# Patient Record
Sex: Female | Born: 1945 | Race: White | Hispanic: No | Marital: Married | Smoking: Former smoker
Health system: Southern US, Community
[De-identification: ages and names within clinical notes are randomized; demographics above are authoritative.]

## PROBLEM LIST (undated history)

## (undated) DIAGNOSIS — E785 Hyperlipidemia, unspecified: Secondary | ICD-10-CM

## (undated) DIAGNOSIS — M8580 Other specified disorders of bone density and structure, unspecified site: Secondary | ICD-10-CM

## (undated) DIAGNOSIS — D649 Anemia, unspecified: Secondary | ICD-10-CM

## (undated) DIAGNOSIS — H269 Unspecified cataract: Secondary | ICD-10-CM

## (undated) DIAGNOSIS — M810 Age-related osteoporosis without current pathological fracture: Secondary | ICD-10-CM

## (undated) DIAGNOSIS — S8290XA Unspecified fracture of unspecified lower leg, initial encounter for closed fracture: Secondary | ICD-10-CM

## (undated) DIAGNOSIS — Z8719 Personal history of other diseases of the digestive system: Secondary | ICD-10-CM

## (undated) DIAGNOSIS — K219 Gastro-esophageal reflux disease without esophagitis: Secondary | ICD-10-CM

## (undated) DIAGNOSIS — J302 Other seasonal allergic rhinitis: Secondary | ICD-10-CM

## (undated) DIAGNOSIS — T7840XA Allergy, unspecified, initial encounter: Secondary | ICD-10-CM

## (undated) DIAGNOSIS — D689 Coagulation defect, unspecified: Secondary | ICD-10-CM

## (undated) DIAGNOSIS — I959 Hypotension, unspecified: Secondary | ICD-10-CM

## (undated) HISTORY — DX: Other seasonal allergic rhinitis: J30.2

## (undated) HISTORY — PX: UPPER GASTROINTESTINAL ENDOSCOPY: SHX188

## (undated) HISTORY — PX: TOE SURGERY: SHX1073

## (undated) HISTORY — DX: Other specified disorders of bone density and structure, unspecified site: M85.80

## (undated) HISTORY — DX: Gastro-esophageal reflux disease without esophagitis: K21.9

## (undated) HISTORY — PX: OTHER SURGICAL HISTORY: SHX169

## (undated) HISTORY — PX: COLONOSCOPY: SHX174

## (undated) HISTORY — DX: Unspecified cataract: H26.9

## (undated) HISTORY — DX: Anemia, unspecified: D64.9

## (undated) HISTORY — DX: Unspecified fracture of unspecified lower leg, initial encounter for closed fracture: S82.90XA

## (undated) HISTORY — DX: Allergy, unspecified, initial encounter: T78.40XA

## (undated) HISTORY — DX: Hyperlipidemia, unspecified: E78.5

## (undated) HISTORY — DX: Hypotension, unspecified: I95.9

## (undated) HISTORY — DX: Coagulation defect, unspecified: D68.9

## (undated) HISTORY — DX: Personal history of other diseases of the digestive system: Z87.19

## (undated) HISTORY — DX: Age-related osteoporosis without current pathological fracture: M81.0

---

## 1998-10-11 ENCOUNTER — Other Ambulatory Visit: Admission: RE | Admit: 1998-10-11 | Discharge: 1998-10-11 | Payer: Self-pay | Admitting: Gynecology

## 1999-07-06 ENCOUNTER — Encounter: Admission: RE | Admit: 1999-07-06 | Discharge: 1999-07-06 | Payer: Self-pay | Admitting: Emergency Medicine

## 1999-07-06 ENCOUNTER — Encounter: Payer: Self-pay | Admitting: Emergency Medicine

## 1999-11-16 ENCOUNTER — Other Ambulatory Visit: Admission: RE | Admit: 1999-11-16 | Discharge: 1999-11-16 | Payer: Self-pay | Admitting: Gynecology

## 2000-07-18 ENCOUNTER — Encounter: Admission: RE | Admit: 2000-07-18 | Discharge: 2000-07-18 | Payer: Self-pay | Admitting: Emergency Medicine

## 2000-07-18 ENCOUNTER — Encounter: Payer: Self-pay | Admitting: Emergency Medicine

## 2001-01-28 ENCOUNTER — Other Ambulatory Visit: Admission: RE | Admit: 2001-01-28 | Discharge: 2001-01-28 | Payer: Self-pay | Admitting: Gynecology

## 2001-02-08 ENCOUNTER — Encounter (INDEPENDENT_AMBULATORY_CARE_PROVIDER_SITE_OTHER): Payer: Self-pay

## 2001-02-08 ENCOUNTER — Other Ambulatory Visit: Admission: RE | Admit: 2001-02-08 | Discharge: 2001-02-08 | Payer: Self-pay | Admitting: Gynecology

## 2001-12-25 ENCOUNTER — Encounter: Admission: RE | Admit: 2001-12-25 | Discharge: 2001-12-25 | Payer: Self-pay | Admitting: Emergency Medicine

## 2001-12-25 ENCOUNTER — Encounter: Payer: Self-pay | Admitting: Emergency Medicine

## 2002-01-15 ENCOUNTER — Encounter: Payer: Self-pay | Admitting: Emergency Medicine

## 2002-01-15 ENCOUNTER — Encounter: Admission: RE | Admit: 2002-01-15 | Discharge: 2002-01-15 | Payer: Self-pay | Admitting: Emergency Medicine

## 2004-02-10 ENCOUNTER — Other Ambulatory Visit: Admission: RE | Admit: 2004-02-10 | Discharge: 2004-02-10 | Payer: Self-pay | Admitting: Gynecology

## 2004-02-15 ENCOUNTER — Ambulatory Visit (HOSPITAL_COMMUNITY): Admission: RE | Admit: 2004-02-15 | Discharge: 2004-02-15 | Payer: Self-pay | Admitting: Emergency Medicine

## 2005-02-08 ENCOUNTER — Ambulatory Visit: Payer: Self-pay | Admitting: Gastroenterology

## 2005-02-21 ENCOUNTER — Ambulatory Visit: Payer: Self-pay | Admitting: Gastroenterology

## 2005-02-23 ENCOUNTER — Ambulatory Visit (HOSPITAL_COMMUNITY): Admission: RE | Admit: 2005-02-23 | Discharge: 2005-02-23 | Payer: Self-pay | Admitting: Emergency Medicine

## 2005-02-27 ENCOUNTER — Other Ambulatory Visit: Admission: RE | Admit: 2005-02-27 | Discharge: 2005-02-27 | Payer: Self-pay | Admitting: Gynecology

## 2006-04-16 ENCOUNTER — Ambulatory Visit (HOSPITAL_COMMUNITY): Admission: RE | Admit: 2006-04-16 | Discharge: 2006-04-16 | Payer: Self-pay | Admitting: Emergency Medicine

## 2006-04-17 ENCOUNTER — Other Ambulatory Visit: Admission: RE | Admit: 2006-04-17 | Discharge: 2006-04-17 | Payer: Self-pay | Admitting: Gynecology

## 2007-04-25 ENCOUNTER — Ambulatory Visit (HOSPITAL_COMMUNITY): Admission: RE | Admit: 2007-04-25 | Discharge: 2007-04-25 | Payer: Self-pay | Admitting: Emergency Medicine

## 2007-05-20 ENCOUNTER — Other Ambulatory Visit: Admission: RE | Admit: 2007-05-20 | Discharge: 2007-05-20 | Payer: Self-pay | Admitting: Gynecology

## 2008-05-11 ENCOUNTER — Ambulatory Visit (HOSPITAL_COMMUNITY): Admission: RE | Admit: 2008-05-11 | Discharge: 2008-05-11 | Payer: Self-pay | Admitting: Emergency Medicine

## 2008-08-04 ENCOUNTER — Other Ambulatory Visit: Admission: RE | Admit: 2008-08-04 | Discharge: 2008-08-04 | Payer: Self-pay | Admitting: Gynecology

## 2009-03-24 ENCOUNTER — Encounter: Payer: Self-pay | Admitting: Gastroenterology

## 2009-06-02 ENCOUNTER — Ambulatory Visit (HOSPITAL_COMMUNITY): Admission: RE | Admit: 2009-06-02 | Discharge: 2009-06-02 | Payer: Self-pay | Admitting: Emergency Medicine

## 2009-09-17 ENCOUNTER — Encounter (INDEPENDENT_AMBULATORY_CARE_PROVIDER_SITE_OTHER): Payer: Self-pay | Admitting: *Deleted

## 2009-09-27 ENCOUNTER — Encounter (INDEPENDENT_AMBULATORY_CARE_PROVIDER_SITE_OTHER): Payer: Self-pay | Admitting: *Deleted

## 2009-09-27 ENCOUNTER — Ambulatory Visit (HOSPITAL_COMMUNITY): Admission: RE | Admit: 2009-09-27 | Discharge: 2009-09-27 | Payer: Self-pay | Admitting: Obstetrics and Gynecology

## 2009-11-08 ENCOUNTER — Ambulatory Visit: Payer: Self-pay | Admitting: Gastroenterology

## 2010-02-22 ENCOUNTER — Encounter (INDEPENDENT_AMBULATORY_CARE_PROVIDER_SITE_OTHER): Payer: Self-pay | Admitting: *Deleted

## 2010-02-28 ENCOUNTER — Encounter (INDEPENDENT_AMBULATORY_CARE_PROVIDER_SITE_OTHER): Payer: Self-pay | Admitting: *Deleted

## 2010-03-01 ENCOUNTER — Ambulatory Visit: Payer: Self-pay | Admitting: Gastroenterology

## 2010-03-17 ENCOUNTER — Ambulatory Visit: Payer: Self-pay | Admitting: Gastroenterology

## 2010-10-13 NOTE — Letter (Signed)
Summary: New Patient letter  [facility]  [address] [place], [place] [address]   Phone: [phone]  Fax: [phone]       09/17/2009 MRN: [id number]  [facility] [address] [place], [place]  [address]  Dear Alexa Morris,  Welcome to the Gastroenterology Division at [facility].    You are scheduled to see Dr.  Arlyce Dice on 10-13-09 at 10AM on the 3rd floor at [facility], [address]  We ask that you try to arrive at our office 15 minutes prior to your appointment time to allow for check-in.  We would like you to complete the enclosed self-administered evaluation form prior to your visit and bring it with you on the day of your appointment.  We will review it with you.  Also, please bring a complete list of all your medications or, if you prefer, bring the medication bottles and we will list them.  Please bring your insurance card so that we may make a copy of it.  If your insurance requires a referral to see a specialist, please bring your referral form from your primary care physician.  Co-payments are due at the time of your visit and may be paid by cash, check or credit card.     Your office visit will consist of a consult with your physician (includes a physical exam), any laboratory testing he/she may order, scheduling of any necessary diagnostic testing (e.g. x-ray, ultrasound, CT-scan), and scheduling of a procedure (e.g. Endoscopy, Colonoscopy) if required.  Please allow enough time on your schedule to allow for any/all of these possibilities.    If you cannot keep your appointment, please call [phone] to cancel or reschedule prior to your appointment date.  This allows [place] the opportunity to schedule an appointment for another patient in need of care.  If you do not cancel or reschedule by 5 p.m. the business day prior to your appointment date, you will be charged a $50.00 late cancellation/no-show fee.    Thank you for choosing  [facility] for your medical needs.  We appreciate the opportunity to care for you.  Please visit [place] at our website  to learn more about our practice.                     Sincerely,                                                             The Gastroenterology Division

## 2010-10-13 NOTE — Op Note (Signed)
Alexa Morris, Alexa Morris             ACCOUNT NO.:  [id number]      MEDICAL RECORD NO.:  [id number]          PATIENT TYPE:  AMB      LOCATION:  SDC                           FACILITY:  [facility]      PHYSICIAN:  Guy Sandifer. Henderson Cloud, M.D. DATE OF BIRTH:  1946-06-21      DATE OF PROCEDURE:  09/27/2009   DATE OF DISCHARGE:  09/27/2009                                  OPERATIVE REPORT      PREOPERATIVE DIAGNOSIS:  Endometrial polyp.      POSTOPERATIVE DIAGNOSIS:  Endometrial polyp.      PROCEDURE:  Hysteroscopy with resectoscope, dilatation and curettage.      SURGEON:  Guy Sandifer. Henderson Cloud, MD      ANESTHESIA:  General with LMA.      SPECIMENS:   1. Endometrial resection.   2. Endometrial curetting, both to Pathology.      ESTIMATED BLOOD LOSS:  Minimal.      I and O's are distending media, 35 mL deficit.      INDICATIONS AND CONSENT:  This patient is a 65 year old married white   female with an endometrial polypoid-type structure noted on ultrasound.   Hysteroscopy, resectoscope, dilatation and curettage has been discussed   preoperatively.  Potential risks and complications have been discussed   preoperatively including, but not limited to infection, organ damage,   uterine perforation, bleeding requiring transfusion of blood products   with HIV and hepatitis acquisition, DVT, PE, pneumonia, laparoscopy,   laparotomy, recurrent polyps, hysterectomy.  All questions have been   answered and consent is signed on the chart.      FINDINGS:  Fallopian tube ostia identified bilaterally.  There is a 10   to 12 mm polypoid-type structure off the upper uterine cavity.  The   endometrium was atrophic.      PROCEDURE IN DETAILS:  The patient was taken to the operating room where   she was identified, placed in dorsosupine position and general   anesthesia was induced via LMA.  She was placed in dorsal lithotomy   position.  Time-out was undertaken.  She was prepped with Betadine.   Bladder  straight catheterized and draped in a sterile fashion.  Bivalve   speculum was placed in the vagina.  Anterior cervical lip was injected   with 1% plain Xylocaine and grasped with a single-tooth tenaculum.   Paracervical block was placed at 2, 4, 5, 7, 8, and 10 o'clock positions   with approximately 20 mL total of the same solution.  Cervix was gently   progressively dilated.  A 2.9 resectoscope was placed in the   endocervical canal and advanced under direct visualization using   distending media.  The above findings were noted.  The polyp was   resected in a simple fashion with a single right-angle wire loop.  This   removed   the entire polyp.  Excellent hemostasis was noted.  Sharp curettage was   carried out for scant amount of tissue.  Good hemostasis was noted.  All   counts were correct.  The  patient was awakened, taken to recovery room   in stable condition.                  Guy Sandifer Henderson Cloud, M.D.   Electronically Signed            JET/MEDQ  D:  09/27/2009  T:  09/28/2009  Job:  [id number]

## 2010-10-13 NOTE — Letter (Signed)
Summary: [facility] Instructions  [facility]  [address] [place], [place] [address]   Phone: [phone]  Fax: [phone]       [facility]    27-Jan-1946    MRN: [id number]        Procedure Day /Date:  03/17/10  Thursday     Arrival Time:  2:00pm         Procedure Time:  3:00pm     Location of Procedure:                    _x _  [facility] (4th Floor)   PREPARATION FOR COLONOSCOPY WITH MOVIPREP   Starting 5 days prior to your procedure _7/2/11 _ do not eat nuts, seeds, popcorn, corn, beans, peas,  salads, or any raw vegetables.  Do not take any fiber supplements (e.g. Metamucil, Citrucel, and Benefiber).  THE DAY BEFORE YOUR PROCEDURE         DATE:  03/16/10  DAY:  Wednesday  1.  Drink clear liquids the entire day-NO SOLID FOOD  2.  Do not drink anything colored red or purple.  Avoid juices with pulp.  No orange juice.  3.  Drink at least 64 oz. (8 glasses) of fluid/clear liquids during the day to prevent dehydration and help the prep work efficiently.  CLEAR LIQUIDS INCLUDE: Water Jello Ice Popsicles Tea (sugar ok, no milk/cream) Powdered fruit flavored drinks Coffee (sugar ok, no milk/cream) Gatorade Juice: apple, white grape, white cranberry  Lemonade Clear bullion, consomm, broth Carbonated beverages (any kind) Strained chicken noodle soup Hard Candy                             4.  In the morning, mix first dose of MoviPrep solution:    Empty 1 Pouch A and 1 Pouch B into the disposable container    Add lukewarm drinking water to the top line of the container. Mix to dissolve    Refrigerate (mixed solution should be used within 24 hrs)  5.  Begin drinking the prep at 5:00 p.m. The MoviPrep container is divided by 4 marks.   Every 15 minutes drink the solution down to the next mark (approximately 8 oz) until the full liter is complete.   6.  Follow completed prep with 16 oz of clear liquid of your choice (Nothing red or  purple).  Continue to drink clear liquids until bedtime.  7.  Before going to bed, mix second dose of MoviPrep solution:    Empty 1 Pouch A and 1 Pouch B into the disposable container    Add lukewarm drinking water to the top line of the container. Mix to dissolve    Refrigerate  THE DAY OF YOUR PROCEDURE      DATE:  03/17/10 DAY:  Thursday  Beginning at  10:00am  (5 hours before procedure):         1. Every 15 minutes, drink the solution down to the next mark (approx 8 oz) until the full liter is complete.  2. Follow completed prep with 16 oz. of clear liquid of your choice.    3. You may drink clear liquids until 1:00pm   (2 HOURS BEFORE PROCEDURE).   MEDICATION INSTRUCTIONS  Unless otherwise instructed, you should take regular prescription medications with a small sip of water   as early as possible the morning of your procedure.   Additional medication instructions:  n/a         OTHER INSTRUCTIONS  You will need a responsible adult at least 63 years of age to accompany you and drive you home.   This person must remain in the waiting room during your procedure.  Wear loose fitting clothing that is easily removed.  Leave jewelry and other valuables at home.  However, you may wish to bring a book to read or  an iPod/MP3 player to listen to music as you wait for your procedure to start.  Remove all body piercing jewelry and leave at home.  Total time from sign-in until discharge is approximately 2-3 hours.  You should go home directly after your procedure and rest.  You can resume normal activities the  day after your procedure.  The day of your procedure you should not:   Drive   Make legal decisions   Operate machinery   Drink alcohol   Return to work  You will receive specific instructions about eating, activities and medications before you leave.    The above instructions have been reviewed and explained to me by  Sherren Kerns RN  March 01, 2010 3:09  PM     I fully understand and can verbalize these instructions _____________________________ Date _________

## 2010-10-13 NOTE — Procedures (Signed)
Summary: Colonoscopy  Patient: Alexa Morris Note: All result statuses are Final unless otherwise noted.  Tests: (1) Colonoscopy (COL)   COL Colonoscopy           DONE     [facility]     [address]     [place], [place]  [address]           COLONOSCOPY PROCEDURE REPORT           PATIENT:  Alexa Morris, Alexa Morris  MR#:  [id number]     BIRTHDATE:  June 19, 1946, 64 yrs. old  GENDER:  female           ENDOSCOPIST:  Barbette Hair. Arlyce Dice, MD     Referred by:           PROCEDURE DATE:  03/17/2010     PROCEDURE:  Diagnostic Colonoscopy     ASA CLASS:  Class I     INDICATIONS:  1) Elevated Risk Screening  2) family history of     colon cancer Father           MEDICATIONS:   Fentanyl 75 mcg IV, Versed 6 mg IV           DESCRIPTION OF PROCEDURE:   After the risks benefits and     alternatives of the procedure were thoroughly explained, informed     consent was obtained.  Digital rectal exam was performed and     revealed no abnormalities.   The LB CF-[id number] endoscope     was introduced through the anus and advanced to the cecum, which     was identified by both the appendix and ileocecal valve, without     limitations.  The quality of the prep was excellent, using     MoviPrep.  The instrument was then slowly withdrawn as the colon     was fully examined.     <<PROCEDUREIMAGES>>           FINDINGS:  A normal appearing cecum, ileocecal valve, and     appendiceal orifice were identified. The ascending, hepatic     flexure, transverse, splenic flexure, descending, sigmoid colon,     and rectum appeared unremarkable (see image1, image2, image3,     image4, image6, image10, image12, and image13).   Retroflexed     views in the rectum revealed no abnormalities.    The time to     cecum =  8.25  minutes. The scope was then withdrawn (time =  6.0     min) from the patient and the procedure completed.           COMPLICATIONS:  A complication of hypoxemia occured on 03/17/2010  at.  Pt developed very transient hypoxemia (O2 sat 78% for less     than 3 seconds)           ENDOSCOPIC IMPRESSION:     1) Normal colon     RECOMMENDATIONS:     1) Given your significant family history of colon cancer, you     should have a repeat colonoscopy in 5 years           Pt will be examined with propofol sedation in the future           REPEAT EXAM:  In 5 year(s) for Colonoscopy.           ______________________________     Barbette Hair. Arlyce Dice, MD           CC:  Leslee Home, MD           n.     Rosalie Doctor:   Barbette Hair. Lucky Alverson at 03/17/2010 03:52 PM           Regino Schultze, [id number]  Note: An exclamation mark (!) indicates a result that was not dispersed into the flowsheet. Document Creation Date: 03/17/2010 3:53 PM _______________________________________________________________________  (1) Order result status: Final Collection or observation date-time: 03/17/2010 15:46 Requested date-time:  Receipt date-time:  Reported date-time:  Referring Physician:   Ordering Physician: Melvia Heaps [phone]) Specimen Source:  Source: Launa Grill Order Number: [id number] Lab site:   Appended Document: Colonoscopy    Clinical Lists Changes  Observations: Added new observation of COLONNXTDUE: 03/2015 (03/17/2010 16:03)

## 2010-10-13 NOTE — Miscellaneous (Signed)
Summary: previsit/rm  Clinical Lists Changes  Medications: Added new medication of MOVIPREP 100 GM  SOLR (PEG-KCL-NACL-NASULF-NA ASC-C) As per prep instructions. - Signed Rx of MOVIPREP 100 GM  SOLR (PEG-KCL-NACL-NASULF-NA ASC-C) As per prep instructions.;  #1 x 0;  Signed;  Entered by: Sherren Kerns RN;  Authorized by: Louis Meckel MD;  Method used: Electronically to [facility]  [address] [phone]*, [address] [place], [place]  [address], Ph: [phone] or [phone], Fax: [phone] Allergies: Added new allergy or adverse reaction of ASPIRIN Observations: Added new observation of ALLERGY REV: Done (03/01/2010 14:24)    Prescriptions: MOVIPREP 100 GM  SOLR (PEG-KCL-NACL-NASULF-NA ASC-C) As per prep instructions.  #1 x 0   Entered by:   Sherren Kerns RN   Authorized by:   Louis Meckel MD   Signed by:   Sherren Kerns RN on 03/01/2010   Method used:   [facility]        [facility]  [address] [phone]* (retail)       [address]       [place], [place]  [address]       Ph: [phone] or [phone]       Fax: [phone]   RxID:   [id number]

## 2010-10-13 NOTE — Assessment & Plan Note (Signed)
Summary: CHANGE IN BM & HEMORROIDS/YF   History of Present Illness Visit Type: Initial Consult Primary GI MD: Melvia Heaps MD [facility] Requesting Provider: Leslee Home, MD Chief Complaint: Change in bowel habits History of Present Illness:   Alexa Morris is a pleasant 65 year old white female referred at the request of Dr. Lorenz Coaster for colonoscopy.  Family history is pertinent for her father who had colon cancer.  Last colonoscopy in 2006 was normal.  She had several days of lower abdominal pain and passage of dark colored stools.  Symptoms subsided following removal of a uterine polyp.  She is now has no GI complaints.   GI Review of Systems    Reports abdominal pain.     Location of  Abdominal pain: upper abdomen.    Denies acid reflux, belching, bloating, chest pain, dysphagia with liquids, dysphagia with solids, heartburn, loss of appetite, nausea, vomiting, vomiting blood, weight loss, and  weight gain.      Reports black tarry stools, change in bowel habits, and  hemorrhoids.     Denies anal fissure, constipation, diarrhea, diverticulosis, fecal incontinence, heme positive stool, irritable bowel syndrome, jaundice, light color stool, liver problems, rectal bleeding, and  rectal pain. Preventive Screening-Counseling & Management  Alcohol-Tobacco     Smoking Status: quit      Drug Use:  no.      Current Medications (verified): 1)  Fexofenadine Hcl 180 Mg Tabs (Fexofenadine Hcl) .... As Needed 2)  Caltrate 600+d 600-400 Mg-Unit Tabs (Calcium Carbonate-Vitamin D) .... Once Daily 3)  Multivitamins  Tabs (Multiple Vitamin) .... Once Daily  Allergies (verified): 1)  ! * Z-Pac 2)  ! * Charcoal Medicine  Past History:  Past Medical History: Reviewed history from 10/12/2009 and no changes required. 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  Alcohol Use - yes Daily Caffeine Use Illicit Drug Use - no Smoking Status:  quit Drug Use:  no  Review of Systems       The patient complains of allergy/sinus and nosebleeds.  The patient denies anemia, anxiety-new, arthritis/joint pain, back pain, blood in urine, breast changes/lumps, change in vision, confusion, cough, coughing up blood, depression-new, fainting, fatigue, fever, headaches-new, hearing problems, heart murmur, heart rhythm changes, itching, menstrual pain, muscle pains/cramps, night sweats, pregnancy symptoms, shortness of breath, skin rash, sleeping problems, sore throat, swelling of feet/legs, swollen lymph glands, thirst - excessive , urination - excessive , urination changes/pain, urine leakage, vision changes, and voice change.         All other systems were reviewed and were negative   Vital Signs:  Patient profile:   65 year old female Height:      63 inches Weight:      117.38 pounds BMI:     20.87 Pulse rate:   64 / minute BP sitting:   98 / 66  (left arm) Cuff size:   regular  Vitals Entered By: June McMurray CMA Duncan Dull) (November 08, 2009 4:07 PM)  Physical Exam  Additional Exam:  She is a healthy-appearing female  skin: anicteric HEENT: normocephalic; PEERLA; no nasal or pharyngeal abnormalities neck: supple nodes: no cervical lymphadenopathy chest: clear to ausculatation and percussion heart: no murmurs, gallops, or rubs abd: soft, nontender; BS normoactive; no abdominal masses, tenderness, organomegaly rectal: deferred ext: no cynanosis, clubbing, edema skeletal: no deformities  neuro: oriented x 3; no focal abnormalities    Impression & Recommendations:  Problem # 1:  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  Patient's questions were answered.  Patient Instructions: 1)  CC Dr. Leslee Home 2)  You will need to call back to schedule your colonoscopy at your convenience 3)  You will be scheduled for a previsit with a nurse at the time they schedule you for your screening colonoscopy 4)  The medication list was reviewed and reconciled.  All changed / newly prescribed medications were explained.  A complete medication list was provided to the patient / caregiver.

## 2010-10-13 NOTE — Procedures (Signed)
Summary: Colonoscopy   Colonoscopy  Procedure date:  02/21/2005  Findings:      Results: Normal. Location:  [facility].    Comments:      Repeat colonoscopy in 5 years.  Patient Name: Alexa, Morris MRN:  Procedure Procedures: Colorectal cancer screening, high risk CPT: G0105.  Personnel: Endoscopist: Barbette Hair. Arlyce Dice, MD.  Patient Consent: Procedure, Alternatives, Risks and Benefits discussed, consent obtained, from patient.  Indications  Increased Risk Screening: For family history of colorectal neoplasia, in  parent grandparent  History  Current Medications: Patient is not currently taking Coumadin.  Pre-Exam Physical: Performed Feb 21, 2005. Cardio-pulmonary exam, HEENT exam , Abdominal exam WNL.  Exam Exam: Extent of exam reached: Cecum, extent intended: Cecum.  The cecum was identified by IC valve. Colon retroflexion performed. ASA Classification: I. Tolerance: good.  Monitoring: Pulse and BP monitoring, Oximetry used. Supplemental O2 given. at 2 Liters.  Colon Prep Used Miralax for colon prep. Prep results: good.  Sedation Meds: Patient assessed and found to be appropriate for moderate (conscious) sedation. Sedation was managed by the Endoscopist. Fentanyl 100 mcg. given IV. Versed 10 mg. given IV.  Findings - NORMAL EXAM: Cecum to Rectum.  NORMAL EXAM: Cecum.  NORMAL EXAM: Rectum.   Assessment Normal examination.  Events  Unplanned Interventions: No intervention was required.  Unplanned Events: There were no complications. Plans  Post Exam Instructions: Post sedation instructions given.  Patient Education: Patient given standard instructions for: a normal exam.  Scheduling/Referral: Colonoscopy, to Molly Maduro D. Arlyce Dice, MD, around Feb 21, 2010.    This report was created from the original endoscopy report, which was reviewed and signed by the above listed endoscopist.

## 2010-10-13 NOTE — Letter (Signed)
Summary: Colonoscopy Letter  [facility]  [address] [place], [place] [address]   Phone: [phone]  Fax: [phone]      February 22, 2010 MRN: [id number]   [facility] [address] [place], [place]  [address]   Dear Ms. Massmann,   According to your medical record, it is time for you to schedule a Colonoscopy. The American Cancer Society recommends this procedure as a method to detect early colon cancer. Patients with a family history of colon cancer, or a personal history of colon polyps or inflammatory bowel disease are at increased risk.  This letter has been generated based on the recommendations made at the time of your procedure. If you feel that in your particular situation this may no longer apply, please contact our office.  Please call our office at [phone] to schedule this appointment or to update your records at your earliest convenience.  Thank you for cooperating with [place] to provide you with the very best care possible.   Sincerely,  Barbette Hair. Arlyce Dice, M.d.  [facility] [phone]

## 2010-10-31 ENCOUNTER — Encounter (HOSPITAL_COMMUNITY): Payer: Self-pay

## 2010-10-31 ENCOUNTER — Other Ambulatory Visit (HOSPITAL_COMMUNITY): Payer: Self-pay | Admitting: Emergency Medicine

## 2010-10-31 ENCOUNTER — Ambulatory Visit (HOSPITAL_COMMUNITY)
Admission: RE | Admit: 2010-10-31 | Discharge: 2010-10-31 | Disposition: A | Discharge status: Home / Self Care | Payer: BC Managed Care – PPO | Source: Ambulatory Visit | Attending: Emergency Medicine | Admitting: Emergency Medicine

## 2010-10-31 DIAGNOSIS — Z1231 Encounter for screening mammogram for malignant neoplasm of breast: Secondary | ICD-10-CM

## 2010-11-27 LAB — COMPREHENSIVE METABOLIC PANEL
ALT: 16 U/L (ref 0–35)
Albumin: 4.1 g/dL (ref 3.5–5.2)
BUN: 22 mg/dL (ref 6–23)
CO2: 27 mEq/L (ref 19–32)
Chloride: 103 mEq/L (ref 96–112)
Creatinine, Ser: 0.69 mg/dL (ref 0.4–1.2)
GFR calc Af Amer: >60 mL/min (ref >60)
Glucose, Bld: 87 mg/dL (ref 70–99)
Potassium: 3.9 mEq/L (ref 3.5–5.1)
Sodium: 138 mEq/L (ref 135–145)
Total Protein: 7.2 g/dL (ref 6.0–8.3)

## 2010-11-27 LAB — CBC
HCT: 40.1 % (ref 36.0–46.0)
Hemoglobin: 13.6 g/dL (ref 12.0–15.0)
Platelets: 260 10*3/uL (ref 150–400)
RBC: 4.28 MIL/uL (ref 3.87–5.11)

## 2011-03-13 ENCOUNTER — Other Ambulatory Visit (HOSPITAL_COMMUNITY): Payer: Self-pay | Admitting: Emergency Medicine

## 2011-03-13 DIAGNOSIS — M8580 Other specified disorders of bone density and structure, unspecified site: Secondary | ICD-10-CM

## 2011-03-16 ENCOUNTER — Ambulatory Visit (HOSPITAL_COMMUNITY)
Admission: RE | Admit: 2011-03-16 | Discharge: 2011-03-16 | Disposition: A | Discharge status: Home / Self Care | Payer: BC Managed Care – PPO | Source: Ambulatory Visit | Attending: Emergency Medicine | Admitting: Emergency Medicine

## 2011-03-16 DIAGNOSIS — Z13820 Encounter for screening for osteoporosis: Secondary | ICD-10-CM | POA: Insufficient documentation

## 2011-03-16 DIAGNOSIS — M899 Disorder of bone, unspecified: Secondary | ICD-10-CM | POA: Insufficient documentation

## 2011-03-16 DIAGNOSIS — M8580 Other specified disorders of bone density and structure, unspecified site: Secondary | ICD-10-CM

## 2012-03-25 ENCOUNTER — Other Ambulatory Visit: Payer: Self-pay | Admitting: Surgery

## 2012-04-24 ENCOUNTER — Other Ambulatory Visit (HOSPITAL_COMMUNITY): Payer: Self-pay | Admitting: Internal Medicine

## 2012-04-24 DIAGNOSIS — Z1231 Encounter for screening mammogram for malignant neoplasm of breast: Secondary | ICD-10-CM

## 2012-05-16 ENCOUNTER — Ambulatory Visit (HOSPITAL_COMMUNITY): Payer: BC Managed Care – PPO

## 2012-05-22 ENCOUNTER — Ambulatory Visit (HOSPITAL_COMMUNITY)
Admission: RE | Admit: 2012-05-22 | Discharge: 2012-05-22 | Disposition: A | Discharge status: Home / Self Care | Payer: BC Managed Care – PPO | Source: Ambulatory Visit | Attending: Internal Medicine | Admitting: Internal Medicine

## 2012-05-22 DIAGNOSIS — Z1231 Encounter for screening mammogram for malignant neoplasm of breast: Secondary | ICD-10-CM

## 2012-07-10 ENCOUNTER — Ambulatory Visit (INDEPENDENT_AMBULATORY_CARE_PROVIDER_SITE_OTHER): Payer: BC Managed Care – PPO | Admitting: Family Medicine

## 2012-07-10 VITALS — BP 126/78 | HR 81 | Temp 98.8°F | Resp 17 | Ht 62.5 in | Wt 127.0 lb

## 2012-07-10 DIAGNOSIS — J029 Acute pharyngitis, unspecified: Secondary | ICD-10-CM

## 2012-07-10 MED ORDER — AMOXICILLIN 875 MG PO TABS: 875.0000 mg | ORAL_TABLET | Freq: Two times a day (BID) | ORAL | Status: DC

## 2012-07-10 NOTE — Progress Notes (Signed)
@UMFCLOGO @   Patient ID: Alexa Morris MRN: [id number], DOB: Feb 19, 1946, 66 y.o. Date of Encounter: 07/10/2012, 6:47 PM  Primary Physician: No primary provider on file.  Chief Complaint:  Chief Complaint  Patient presents with  . Sore Throat    1 week     HPI: 66 y.o. year old female presents with 7 day history of sore throat. Subjective fever and chills. No cough, congestion, rhinorrhea, sinus pressure, otalgia, or headache. Normal hearing. No GI complaints. Able to swallow saliva, but hurts to do so. Decreased appetite secondary to sore throat.   History reviewed. No pertinent past medical history.   Home Meds: Prior to Admission medications   Medication Sig Start Date End Date Taking? Authorizing Provider  atorvastatin (LIPITOR) 20 MG tablet Take 20 mg by mouth daily.   Yes Historical Provider, MD  Calcium Carbonate-Vitamin D (CALTRATE 600+D) 600-400 MG-UNIT per tablet Take 1 tablet by mouth daily.     Yes Historical Provider, MD  amoxicillin (AMOXIL) 875 MG tablet Take 1 tablet (875 mg total) by mouth 2 (two) times daily. 07/10/12   Elvina Sidle, MD  fexofenadine (ALLEGRA) 180 MG tablet Take 180 mg by mouth as needed.      Historical Provider, MD  Multiple Vitamin (MULTIVITAMIN) tablet Take 1 tablet by mouth daily.      Historical Provider, MD    Allergies:  Allergies  Allergen Reactions  . Aspirin     REACTION: does not take,clotting slow    History   Social History  . Marital Status: Married    Spouse Name: N/A    Number of Children: N/A  . Years of Education: N/A   Occupational History  . Not on file.   Social History Main Topics  . Smoking status: Never Smoker   . Smokeless tobacco: Not on file  . Alcohol Use: 0.6 oz/week    1 Glasses of wine per week  . Drug Use: Not on file  . Sexually Active: No   Other Topics Concern  . Not on file   Social History Narrative  . No narrative on file     Review of Systems: Constitutional: negative for  chills, fever, night sweats or weight changes HEENT: see above Cardiovascular: negative for chest pain or palpitations Respiratory: negative for hemoptysis, wheezing, or shortness of breath Abdominal: negative for abdominal pain, nausea, vomiting or diarrhea Dermatological: negative for rash Neurologic: negative for headache   Physical Exam: Blood pressure 126/78, pulse 81, temperature 98.8 F (37.1 C), temperature source Oral, resp. rate 17, height 5' 2.5" (1.588 m), weight 127 lb (57.607 kg), SpO2 99.00%., Body mass index is 22.86 kg/(m^2). General: Well developed, well nourished, in no acute distress. Head: Normocephalic, atraumatic, eyes without discharge, sclera non-icteric, nares are patent. Bilateral auditory canals clear, TM's are without perforation, pearly grey with reflective cone of light bilaterally. No sinus TTP. Oral cavity moist, dentition normal. Posterior pharynx with post nasal drip and mild erythema. No peritonsillar abscess or tonsillar exudate. Neck: Supple. No thyromegaly. Full ROM. No lymphadenopathy. Lungs: Clear bilaterally to auscultation without wheezes, rales, or rhonchi. Breathing is unlabored. Heart: RRR with S1 S2. No murmurs, rubs, or gallops appreciated. Abdomen: Soft, non-tender, non-distended with normoactive bowel sounds. No hepatomegaly. No rebound/guarding. No obvious abdominal masses. Msk:  Strength and tone normal for age. Extremities: No clubbing or cyanosis. No edema. Neuro: Alert and oriented X 3. Moves all extremities spontaneously. CNII-XII grossly in tact. Psych:  Responds to questions appropriately with a normal  affect.      ASSESSMENT AND PLAN:  66 y.o. year old female with pharyngitis 1. Pharyngitis  amoxicillin (AMOXIL) 875 MG tablet, Culture, Group A Strep    - -Tylenol/Motrin prn -Rest/fluids -RTC precautions -RTC 3-5 days if no improvement  Signed, Elvina Sidle, MD 07/10/2012 6:47 PM

## 2012-07-10 NOTE — Patient Instructions (Signed)
Faringitis (viral y bacteriana) (Pharyngitis, Viral and Bacterial) Es una inflamacin (irritacin) o infeccin de la faringe. Tambin se denomina dolor de garganta.  CAUSAS La mayor parte de las anginas son causadas por virus y son parte de un resfro. Sin embargo, algunas anginas son ocasionadas por la bacteria estreptococo (germen) o por otras bacterias. La causa de la angina tambin puede ser el goteo nasal proveniente de los senos paranasales, alergias y, en algunos casos, se produce incluso por dormir con la boca abierta. Las anginas infecciosas pueden contagiarse de una persona a otra por la tos, el estornudo y por compartir tasas o cubiertos. TRATAMIENTO Las anginas de causa viral generalmente duran entre 3 y 4 das. Estas infecciones virales generalmente mejoran sin antibiticos (medicamentos que destruyen grmenes). Las anginas por estreptococo y otras bacterias (grmenes) generalmente mejoran despus de 24 a 48 horas de comenzar el tratamiento con antibiticos. INSTRUCCIONES PARA EL CUIDADO DOMICILIARIO  Si en profesional considera que se trata de una infeccin bacteriana o si hay una prueba positiva para el estreptococo, le prescribir un antibitico. Debe tomar todos los antibiticos hasta completar el tratamiento. Si no completa el tratamiento con antibiticos, usted o su hijo podrn enfermar nuevamente. Si usted o su hijo presentan un estreptococo en la garganta y no completan el tratamiento, podran sufrir un trastorno grave en el corazn o en los riones.   Beba gran cantidad de lquidos. Alrededor de 8-10 vasos grandes de agua por da. (Puede ser agua, jugos, licuados de frutas, Kool-aid, Gatorade, gaseosas, etc.).   Utilice los medicamentos de venta libre o de prescripcin para el dolor, el malestar o la fiebre, segn se lo indique el profesional que lo asiste.   Descanse lo suficiente.   Hgase grgaras con agua salada (1/2 cucharadita de sal en un vaso de agua) cada 1-2 horas si  lo necesita para aliviar las molestias.   Si el paciente es mayor de siete aos, ofrzcale caramelos duros o aerosoles o pastillas para la tos.  SOLICITE ATENCIN MDICA SI:  Si le aparecen bultos grandes y dolorosos en el cuello.   Presenta una erupcin.   Elimina un esputo verde, marrn-amarillento o sanguinolento.   El beb tiene ms de 3 meses y su temperatura rectal es de 100.5 F (38.1 C) o ms durante ms de 1 da.  SOLICITE ATENCIN MDICA DE INMEDIATO SI:  Siente rigidez en el cuello.   Usted o su hijo babean o no pueden tragar lquidos.   Usted o su hijo vomitan, no pueden retener los medicamentos o los lquidos.   Usted o su hijo presentan dolor intenso, que no se alivia con los medicamentos que le han recetado.   Usted o su hijo presentan dificultad para respirar (y no se debe a la nariz congestionada).   Usted o su hijo no pueden abrir la boca completamente.   Usted o su nio presentan aumento del dolor, hinchazn, enrojecimiento en el cuello.   Tiene fiebre.   Su beb tiene ms de 3 meses y su temperatura rectal es de 102 F (38.9 C) o ms.   Su beb tiene 3 meses o menos y su temperatura rectal es de 100.4 F (38 C) o ms.  EST SEGURO QUE:   Comprende las instrucciones para el alta mdica.   Controlar su enfermedad.   Solicitar atencin mdica de inmediato segn las indicaciones.  Document Released: 06/07/2005 Document Revised: 08/17/2011 [employer] Patient Information 2012 [employer]. 

## 2012-07-13 LAB — CULTURE, GROUP A STREP: Organism ID, Bacteria: NORMAL

## 2013-06-30 ENCOUNTER — Other Ambulatory Visit (HOSPITAL_COMMUNITY): Payer: Self-pay | Admitting: Internal Medicine

## 2013-06-30 DIAGNOSIS — Z1231 Encounter for screening mammogram for malignant neoplasm of breast: Secondary | ICD-10-CM

## 2013-07-10 ENCOUNTER — Ambulatory Visit (HOSPITAL_COMMUNITY)
Admission: RE | Admit: 2013-07-10 | Discharge: 2013-07-10 | Disposition: A | Discharge status: Home / Self Care | Payer: BC Managed Care – PPO | Source: Ambulatory Visit | Attending: Internal Medicine | Admitting: Internal Medicine

## 2013-07-10 DIAGNOSIS — Z1231 Encounter for screening mammogram for malignant neoplasm of breast: Secondary | ICD-10-CM | POA: Insufficient documentation

## 2013-10-07 ENCOUNTER — Ambulatory Visit (INDEPENDENT_AMBULATORY_CARE_PROVIDER_SITE_OTHER): Payer: BC Managed Care – PPO | Admitting: Emergency Medicine

## 2013-10-07 VITALS — BP 114/62 | HR 94 | Temp 100.5°F | Resp 18 | Ht 62.0 in | Wt 129.2 lb

## 2013-10-07 DIAGNOSIS — R509 Fever, unspecified: Secondary | ICD-10-CM

## 2013-10-07 DIAGNOSIS — IMO0001 Reserved for inherently not codable concepts without codable children: Secondary | ICD-10-CM

## 2013-10-07 DIAGNOSIS — M791 Myalgia, unspecified site: Secondary | ICD-10-CM

## 2013-10-07 LAB — POCT CBC
GRANULOCYTE PERCENT: 74.7 % (ref 37–80)
HEMATOCRIT: 42 % (ref 37.7–47.9)
HEMOGLOBIN: 13.1 g/dL (ref 12.2–16.2)
Lymph, poc: 2.9 (ref 0.6–3.4)
MCH: 30 pg (ref 27–31.2)
MCHC: 31.2 g/dL — AB (ref 31.8–35.4)
MCV: 96.3 fL (ref 80–97)
MID (CBC): 0.6 (ref 0–0.9)
MPV: 8.2 fL (ref 0–99.8)
PLATELET COUNT, POC: 341 10*3/uL (ref 142–424)
POC GRANULOCYTE: 10.3 — AB (ref 2–6.9)
POC LYMPH PERCENT: 20.7 %L (ref 10–50)
POC MID %: 4.6 % (ref 0–12)
RBC: 4.36 M/uL (ref 4.04–5.48)
RDW, POC: 13.2 %
WBC: 13.8 10*3/uL — AB (ref 4.6–10.2)

## 2013-10-07 NOTE — Patient Instructions (Addendum)

## 2013-10-07 NOTE — Progress Notes (Signed)
[facility] [address] [place] [address] [phone]  Date:  10/07/2013   Name:  Alexa Morris   DOB:  1945-12-09   MRN:  [id number]  PCP:  Jerlyn Ly, MD    Chief Complaint: Back Pain, Hip Pain, Shoulder Pain and Neck Pain   History of Present Illness:  Alexa Morris is a 68 y.o. very pleasant female patient who presents with the following:  Ill since Friday with myalgias and arthralgias and fever to 100.5. Fever most prominent today.  No rash, insect bites, no joint swelling.  No nausea or vomiting, no cough, wheezing or shortness of breath. No sore throat.  No stool change.  Is an Statistician.  No improvement with over the counter medications or other home remedies. Denies other complaint or health concern today.   There are no active problems to display for this patient.   History reviewed. No pertinent past medical history.  Past Surgical History  Procedure Laterality Date  . Laparascopy    . Toe surgery      History  Substance Use Topics  . Smoking status: Never Smoker   . Smokeless tobacco: Not on file  . Alcohol Use: 0.6 oz/week    1 Glasses of wine per week    Family History  Problem Relation Age of Onset  . Kidney disease Father   . Diabetes Son     Allergies  Allergen Reactions  . Aspirin     REACTION: does not take,clotting slow    Medication list has been reviewed and updated.  Current Outpatient Prescriptions on File Prior to Visit  Medication Sig Dispense Refill  . Calcium Carbonate-Vitamin D (CALTRATE 600+D) 600-400 MG-UNIT per tablet Take 1 tablet by mouth daily.        . fexofenadine (ALLEGRA) 180 MG tablet Take 180 mg by mouth as needed.        . Multiple Vitamin (MULTIVITAMIN) tablet Take 1 tablet by mouth daily.        Marland Kitchen atorvastatin (LIPITOR) 20 MG tablet Take 20 mg by mouth daily.       No current facility-administered medications on file prior to visit.    Review of Systems:  Viral  infection Treat symptomatically   Physical Examination: Filed Vitals:   10/07/13 1935  BP: 114/62  Pulse: 94  Temp: 100.5 F (38.1 C)  Resp: 18   Filed Vitals:   10/07/13 1935  Height: 5\' 2"  (1.575 m)  Weight: 129 lb 3.2 oz (58.605 kg)   Body mass index is 23.63 kg/(m^2). Ideal Body Weight: Weight in (lb) to have BMI = 25: 136.4  GEN: WDWN, NAD, Non-toxic, A & O x 3 HEENT: Atraumatic, Normocephalic. Neck supple. No masses, No LAD. Ears and Nose: No external deformity. CV: RRR, No M/G/R. No JVD. No thrill. No extra heart sounds. PULM: CTA B, no wheezes, crackles, rhonchi. No retractions. No resp. distress. No accessory muscle use. ABD: S, NT, ND, +BS. No rebound. No HSM. EXTR: No c/c/e NEURO Normal gait.  PSYCH: Normally interactive. Conversant. Not depressed or anxious appearing.  Calm demeanor.    Assessment and Plan: Viral febrile disorder Fluids NSAID  Signed,  Ellison Carwin, MD   Results for orders placed in visit on 10/07/13  POCT CBC      Result Value Range   WBC 13.8 (*) 4.6 - 10.2 K/uL   Lymph, poc 2.9  0.6 - 3.4   POC LYMPH PERCENT 20.7  10 -  50 %L   MID (cbc) 0.6  0 - 0.9   POC MID % 4.6  0 - 12 %M   POC Granulocyte 10.3 (*) 2 - 6.9   Granulocyte percent 74.7  37 - 80 %G   RBC 4.36  4.04 - 5.48 M/uL   Hemoglobin 13.1  12.2 - 16.2 g/dL   HCT, POC 42.0  37.7 - 47.9 %   MCV 96.3  80 - 97 fL   MCH, POC 30.0  27 - 31.2 pg   MCHC 31.2 (*) 31.8 - 35.4 g/dL   RDW, POC 13.2     Platelet Count, POC 341  142 - 424 K/uL   MPV 8.2  0 - 99.8 fL

## 2014-06-12 ENCOUNTER — Emergency Department (HOSPITAL_COMMUNITY)
Admission: EM | Admit: 2014-06-12 | Discharge: 2014-06-12 | Disposition: A | Discharge status: Home / Self Care | Payer: BC Managed Care – PPO | Source: Home / Self Care | Attending: Family Medicine | Admitting: Family Medicine

## 2014-06-12 ENCOUNTER — Encounter (HOSPITAL_COMMUNITY): Payer: Self-pay | Admitting: Emergency Medicine

## 2014-06-12 DIAGNOSIS — J040 Acute laryngitis: Secondary | ICD-10-CM

## 2014-06-12 LAB — POCT RAPID STREP A: Streptococcus, Group A Screen (Direct): NEGATIVE

## 2014-06-12 MED ORDER — IPRATROPIUM BROMIDE 0.06 % NA SOLN: 2.0000 | Freq: Four times a day (QID) | NASAL | Status: DC

## 2014-06-12 MED ORDER — AZITHROMYCIN 250 MG PO TABS: 250.0000 mg | ORAL_TABLET | Freq: Every day | ORAL | Status: DC

## 2014-06-12 MED ORDER — PREDNISONE 10 MG PO TABS: 30.0000 mg | ORAL_TABLET | Freq: Every day | ORAL | Status: DC

## 2014-06-12 NOTE — ED Notes (Signed)
C/o sore throat onset x1 week Sx also include hoarseness Denies f/v/n/d Alert, no signs of acute distress.

## 2014-06-12 NOTE — ED Provider Notes (Signed)
Alexa Morris is a 68 y.o. female who [facility] today for sore throat. Patient is a one week history of sore throat voice. Associated with a mild nonproductive cough. No fevers or chills nausea vomiting or diarrhea or shortness of breath. She's tried gargling warm liquids and Nexium which have not helped. 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 No chest pain palpitations or shortness of breath.   History reviewed. No pertinent past medical history. History  Substance Use Topics  . Smoking status: Never Smoker   . Smokeless tobacco: Not on file  . Alcohol Use: 0.6 oz/week    1 Glasses of wine per week   ROS as above Medications: No current facility-administered medications for this encounter.   Current Outpatient Prescriptions  Medication Sig Dispense Refill  . atorvastatin (LIPITOR) 20 MG tablet Take 20 mg by mouth daily.      Marland Kitchen azithromycin (ZITHROMAX) 250 MG tablet Take 1 tablet (250 mg total) by mouth daily. Take first 2 tablets together, then 1 every day until finished.  6 tablet  0  . Calcium Carbonate-Vitamin D (CALTRATE 600+D) 600-400 MG-UNIT per tablet Take 1 tablet by mouth daily.        . fexofenadine (ALLEGRA) 180 MG tablet Take 180 mg by mouth as needed.        Marland Kitchen ipratropium (ATROVENT) 0.06 % nasal spray Place 2 sprays into both nostrils 4 (four) times daily.  15 mL  1  . Multiple Vitamin (MULTIVITAMIN) tablet Take 1 tablet by mouth daily.        . predniSONE (DELTASONE) 10 MG tablet Take 3 tablets (30 mg total) by mouth daily.  15 tablet  0    Exam:  BP 146/90  Pulse 70  Temp(Src) 97.6 F (36.4 C) (Oral)  Resp 20  SpO2 95% Gen: Well NAD HEENT: EOMI,  MMM posterior pharynx with cobblestoning. Normal tympanic membranes bilaterally. Lungs: Normal work of breathing. CTABL Heart: RRR no MRG Abd: NABS, Soft. Nondistended, Nontender Exts: Brisk capillary refill, warm and well perfused.   Results for orders placed during  the hospital encounter of 06/12/14 (from the past 24 hour(s))  POCT RAPID STREP A ([place])     Status: None   Collection Time    06/12/14  7:25 PM      Result Value Ref Range   Streptococcus, Group A Screen (Direct) NEGATIVE  NEGATIVE   No results found.  Assessment and Plan: 68 y.o. female with laryngitis/pharyngitis.  Plan to treat with prednisone and Atrovent spray. Use Azithromycin if not better.  Call or go to the emergency room if you get worse, have trouble breathing, have chest pains, or palpitations.    Discussed warning signs or symptoms. Please see discharge instructions. Patient expresses understanding.     Gregor Hams, MD 06/12/14 [phone]

## 2014-06-12 NOTE — Discharge Instructions (Signed)
Thank you for coming in today. Please take prednisone for 5 days and use Atrovent nasal spray. Take the azithromycin antibiotics if not better. Call or go to the emergency room if you get worse, have trouble breathing, have chest pains, or palpitations.   Laryngitis At the top of your windpipe is your voice box. It is the source of your voice. Inside your voice box are 2 bands of muscles called vocal cords. When you breathe, your vocal cords are relaxed and open so that air can get into the lungs. When you decide to say something, these cords come together and vibrate. The sound from these vibrations goes into your throat and comes out through your mouth as sound. Laryngitis is an inflammation of the vocal cords that causes hoarseness, cough, loss of voice, sore throat, and dry throat. Laryngitis can be temporary (acute) or long-term (chronic). Most cases of acute laryngitis improve with time.Chronic laryngitis lasts for more than 3 weeks. CAUSES Laryngitis can often be related to excessive smoking, talking, or yelling, as well as inhalation of toxic fumes and allergies. Acute laryngitis is usually caused by a viral infection, vocal strain, measles or mumps, or bacterial infections. Chronic laryngitis is usually caused by vocal cord strain, vocal cord injury, postnasal drip, growths on the vocal cords, or acid reflux. SYMPTOMS   Cough.  Sore throat.  Dry throat. RISK FACTORS  Respiratory infections.  Exposure to irritating substances, such as cigarette smoke, excessive amounts of alcohol, stomach acids, and workplace chemicals.  Voice trauma, such as vocal cord injury from shouting or speaking too loud. DIAGNOSIS  Your cargiver will perform a physical exam. During the physical exam, your caregiver will examine your throat. The most common sign of laryngitis is hoarseness. Laryngoscopy may be necessary to confirm the diagnosis of this condition. This procedure allows your caregiver to look  into the larynx. HOME CARE INSTRUCTIONS  Drink enough fluids to keep your urine clear or pale yellow.  Rest until you no longer have symptoms or as directed by your caregiver.  Breathe in moist air.  Take all medicine as directed by your caregiver.  Do not smoke.  Talk as little as possible (this includes whispering).  Write on paper instead of talking until your voice is back to normal.  Follow up with your caregiver if your condition has not improved after 10 days. SEEK MEDICAL CARE IF:   You have trouble breathing.  You cough up blood.  You have persistent fever.  You have increasing pain.  You have difficulty swallowing. MAKE SURE YOU:  Understand these instructions.  Will watch your condition.  Will get help right away if you are not doing well or get worse. Document Released: 08/28/2005 Document Revised: 11/20/2011 Document Reviewed: 11/03/2010 [facility] Patient Information 2015 [place], [place]. This information is not intended to replace advice given to you by your health care provider. Make sure you discuss any questions you have with your health care provider.

## 2014-06-14 LAB — CULTURE, GROUP A STREP

## 2014-06-23 ENCOUNTER — Encounter: Payer: Self-pay | Admitting: Gastroenterology

## 2015-01-14 ENCOUNTER — Encounter: Payer: Self-pay | Admitting: Gastroenterology

## 2015-02-12 ENCOUNTER — Encounter: Payer: Self-pay | Admitting: Gastroenterology

## 2015-04-20 ENCOUNTER — Ambulatory Visit (INDEPENDENT_AMBULATORY_CARE_PROVIDER_SITE_OTHER): Payer: Medicare Other | Admitting: Gastroenterology

## 2015-04-20 ENCOUNTER — Encounter: Payer: Self-pay | Admitting: Gastroenterology

## 2015-04-20 VITALS — BP 106/60 | HR 72 | Ht 62.4 in | Wt 128.2 lb

## 2015-04-20 DIAGNOSIS — K219 Gastro-esophageal reflux disease without esophagitis: Secondary | ICD-10-CM

## 2015-04-20 DIAGNOSIS — Z800 Family history of malignant neoplasm of digestive organs: Secondary | ICD-10-CM | POA: Diagnosis not present

## 2015-04-20 MED ORDER — NA SULFATE-K SULFATE-MG SULF 17.5-3.13-1.6 GM/177ML PO SOLN: 1.0000 | Freq: Once | ORAL | Status: DC

## 2015-04-20 NOTE — Assessment & Plan Note (Signed)
Plan follow-up colonoscopy  CC Dr. Joylene Draft

## 2015-04-20 NOTE — Assessment & Plan Note (Signed)
Reflux symptoms are well controlled with Nexium.  Patient has requested an upper endoscopy in view of her family history of esophageal CA.

## 2015-04-20 NOTE — Patient Instructions (Signed)

## 2015-04-20 NOTE — Progress Notes (Signed)
    _                                                                                                                History of Present Illness:  Alexa Morris is a pleasant 69 year old white female referred at the request of Dr. Joylene Draft for screening colonoscopy.  Family history is pertinent for father who had both colon and esophageal CA.  Last colonoscopy in 2010 was normal.  She has occasional pyrosis which is fairly well-controlled with Nexium.  She denies dysphagia, change in bowel habits or rectal bleeding.   Past Medical History  Diagnosis Date  . GERD (gastroesophageal reflux disease)   . Osteopenia   . Seasonal allergies    Past Surgical History  Procedure Laterality Date  . Laparascopy    . Toe surgery     family history includes Colon cancer in her father; Diabetes in her son; Esophageal cancer in her father; GER disease in her daughter; Kidney disease in her father. Current Outpatient Prescriptions  Medication Sig Dispense Refill  . alendronate (FOSAMAX) 70 MG tablet Take 70 mg by mouth once a week. Take with a full glass of water on an empty stomach.    . esomeprazole (NEXIUM) 20 MG capsule Take 20 mg by mouth daily at 12 noon.     No current facility-administered medications for this visit.   Allergies as of 04/20/2015 - Review Complete 04/20/2015  Allergen Reaction Noted  . Aspirin  03/01/2010    reports that she has quit smoking. She has never used smokeless tobacco. She reports that she drinks about 0.6 oz of alcohol per week. She reports that she does not use illicit drugs.   Review of Systems: Pertinent positive and negative review of systems were noted in the above HPI section. All other review of systems were otherwise negative.  Vital signs were reviewed in today's medical record Physical Exam: General: Well developed , well nourished, no acute distress Skin: anicteric Head: Normocephalic and atraumatic Eyes:  sclerae anicteric, EOMI Ears:  Normal auditory acuity Mouth: No deformity or lesions Neck: Supple, no masses or thyromegaly Lymph Nodes: no lymphadenopathy Lungs: Clear throughout to auscultation Heart: Regular rate and rhythm; no murmurs, rubs or bruits Gastroinestinal: Soft, non tender and non distended. No masses, hepatosplenomegaly or hernias noted. Normal Bowel sounds Rectal:deferred Musculoskeletal: Symmetrical with no gross deformities  Skin: No lesions on visible extremities Pulses:  Normal pulses noted Extremities: No clubbing, cyanosis, edema or deformities noted Neurological: Alert oriented x 4, grossly nonfocal Cervical Nodes:  No significant cervical adenopathy Inguinal Nodes: No significant inguinal adenopathy Psychological:  Alert and cooperative. Normal mood and affect  See Assessment and Plan under Problem List

## 2015-04-28 DIAGNOSIS — M810 Age-related osteoporosis without current pathological fracture: Secondary | ICD-10-CM | POA: Diagnosis not present

## 2015-04-28 DIAGNOSIS — R8299 Other abnormal findings in urine: Secondary | ICD-10-CM | POA: Diagnosis not present

## 2015-04-28 DIAGNOSIS — E785 Hyperlipidemia, unspecified: Secondary | ICD-10-CM | POA: Diagnosis not present

## 2015-04-28 DIAGNOSIS — N390 Urinary tract infection, site not specified: Secondary | ICD-10-CM | POA: Diagnosis not present

## 2015-05-19 DIAGNOSIS — K219 Gastro-esophageal reflux disease without esophagitis: Secondary | ICD-10-CM | POA: Diagnosis not present

## 2015-05-19 DIAGNOSIS — J309 Allergic rhinitis, unspecified: Secondary | ICD-10-CM | POA: Diagnosis not present

## 2015-05-19 DIAGNOSIS — E785 Hyperlipidemia, unspecified: Secondary | ICD-10-CM | POA: Diagnosis not present

## 2015-05-19 DIAGNOSIS — Z23 Encounter for immunization: Secondary | ICD-10-CM | POA: Diagnosis not present

## 2015-05-19 DIAGNOSIS — R87619 Unspecified abnormal cytological findings in specimens from cervix uteri: Secondary | ICD-10-CM | POA: Diagnosis not present

## 2015-05-19 DIAGNOSIS — M810 Age-related osteoporosis without current pathological fracture: Secondary | ICD-10-CM | POA: Diagnosis not present

## 2015-05-19 DIAGNOSIS — Z0000 Encounter for general adult medical examination without abnormal findings: Secondary | ICD-10-CM | POA: Diagnosis not present

## 2015-05-19 DIAGNOSIS — Z1231 Encounter for screening mammogram for malignant neoplasm of breast: Secondary | ICD-10-CM | POA: Diagnosis not present

## 2015-05-19 DIAGNOSIS — M545 Low back pain: Secondary | ICD-10-CM | POA: Diagnosis not present

## 2015-05-19 DIAGNOSIS — Z6823 Body mass index (BMI) 23.0-23.9, adult: Secondary | ICD-10-CM | POA: Diagnosis not present

## 2015-05-19 DIAGNOSIS — Z1389 Encounter for screening for other disorder: Secondary | ICD-10-CM | POA: Diagnosis not present

## 2015-05-24 DIAGNOSIS — Z1212 Encounter for screening for malignant neoplasm of rectum: Secondary | ICD-10-CM | POA: Diagnosis not present

## 2015-06-21 ENCOUNTER — Encounter: Payer: BC Managed Care – PPO | Admitting: Gastroenterology

## 2015-06-25 ENCOUNTER — Telehealth: Payer: Self-pay | Admitting: *Deleted

## 2015-06-25 NOTE — Telephone Encounter (Signed)
Pt had a colon scheduled for 10-9 with Deatra Ina and was RS to 10-17 with Dr Fuller Plan. Pt noticed her times on her old paper work didn't work and wants to know how to new times as she did not receive new paper work. Instructed to stop all nuts, seeds, etc as of today. Pt had not stopped these foods as directed  Instructed only clear liquids Sunday 10-16, no solid foods.  Start suprep bottle 1 at 6 pm Sunday 10-16 as directed Monday 10-17 do 2nd suprep at 430 am as directed and nothing to drink after 630 am  All meds by 0630 am 10-17. Arrive to 4th floor desk by 0830 am Monday morning  Pt verbalized understanding of new dates and times  Lelan Pons PV

## 2015-06-28 ENCOUNTER — Encounter: Payer: Self-pay | Admitting: Gastroenterology

## 2015-06-28 ENCOUNTER — Encounter: Payer: BC Managed Care – PPO | Admitting: Gastroenterology

## 2015-06-28 ENCOUNTER — Ambulatory Visit (AMBULATORY_SURGERY_CENTER): Payer: Medicare Other | Admitting: Gastroenterology

## 2015-06-28 VITALS — BP 113/64 | HR 64 | Temp 96.9°F | Resp 16 | Ht 62.0 in | Wt 128.0 lb

## 2015-06-28 DIAGNOSIS — K219 Gastro-esophageal reflux disease without esophagitis: Secondary | ICD-10-CM | POA: Diagnosis not present

## 2015-06-28 DIAGNOSIS — K229 Disease of esophagus, unspecified: Secondary | ICD-10-CM | POA: Diagnosis not present

## 2015-06-28 DIAGNOSIS — Z1211 Encounter for screening for malignant neoplasm of colon: Secondary | ICD-10-CM

## 2015-06-28 DIAGNOSIS — K2270 Barrett's esophagus without dysplasia: Secondary | ICD-10-CM | POA: Diagnosis not present

## 2015-06-28 DIAGNOSIS — Z800 Family history of malignant neoplasm of digestive organs: Secondary | ICD-10-CM | POA: Diagnosis present

## 2015-06-28 MED ORDER — SODIUM CHLORIDE 0.9 % IV SOLN: 500.0000 mL | INTRAVENOUS | Status: DC

## 2015-06-28 NOTE — Patient Instructions (Signed)
HANDOUTS GIVEN FOR ANTIREFLUX AND DIVERTICULOSIS.    YOU HAD AN ENDOSCOPIC PROCEDURE TODAY AT [facility]:   Refer to the procedure report that was given to you for any specific questions about what was found during the examination.  If the procedure report does not answer your questions, please call your gastroenterologist to clarify.  If you requested that your care partner not be given the details of your procedure findings, then the procedure report has been included in a sealed envelope for you to review at your convenience later.  YOU SHOULD EXPECT: Some feelings of bloating in the abdomen. Passage of more gas than usual.  Walking can help get rid of the air that was put into your GI tract during the procedure and reduce the bloating. If you had a lower endoscopy (such as a colonoscopy or flexible sigmoidoscopy) you may notice spotting of blood in your stool or on the toilet paper. If you underwent a bowel prep for your procedure, you may not have a normal bowel movement for a few days.  Please Note:  You might notice some irritation and congestion in your nose or some drainage.  This is from the oxygen used during your procedure.  There is no need for concern and it should clear up in a day or so.  SYMPTOMS TO REPORT IMMEDIATELY:   Following lower endoscopy (colonoscopy or flexible sigmoidoscopy):  Excessive amounts of blood in the stool  Significant tenderness or worsening of abdominal pains  Swelling of the abdomen that is new, acute  Fever of 100F or higher   Following upper endoscopy (EGD)  Vomiting of blood or coffee ground material  New chest pain or pain under the shoulder blades  Painful or persistently difficult swallowing  New shortness of breath  Fever of 100F or higher  Black, tarry-looking stools  For urgent or emergent issues, a gastroenterologist can be reached at any hour by calling [phone].   DIET: Your first meal following the  procedure should be a small meal and then it is ok to progress to your normal diet. Heavy or fried foods are harder to digest and may make you feel nauseous or bloated.  Likewise, meals heavy in dairy and vegetables can increase bloating.  Drink plenty of fluids but you should avoid alcoholic beverages for 24 hours.  ACTIVITY:  You should plan to take it easy for the rest of today and you should NOT DRIVE or use heavy machinery until tomorrow (because of the sedation medicines used during the test).    FOLLOW UP: Our staff will call the number listed on your records the next business day following your procedure to check on you and address any questions or concerns that you may have regarding the information given to you following your procedure. If we do not reach you, we will leave a message.  However, if you are feeling well and you are not experiencing any problems, there is no need to return our call.  We will assume that you have returned to your regular daily activities without incident.  If any biopsies were taken you will be contacted by phone or by letter within the next 1-3 weeks.  Please call us at [phone] if you have not heard about the biopsies in 3 weeks.    SIGNATURES/CONFIDENTIALITY: You and/or your care partner have signed paperwork which will be entered into your electronic medical record.  These signatures attest to the fact that that the  information above on your After Visit Summary has been reviewed and is understood.  Full responsibility of the confidentiality of this discharge information lies with you and/or your care-partner.

## 2015-06-28 NOTE — Op Note (Signed)
[place]  [address]. [place], [id number]   ENDOSCOPY PROCEDURE REPORT  PATIENT: Alexa Morris, Alexa Morris  MR#: [id number] BIRTHDATE: 09-Jul-1946 , 4  yrs. old GENDER: female ENDOSCOPIST: Ladene Artist, MD, [facility] REFERRED BY:  Crist Infante, M.D. PROCEDURE DATE:  06/28/2015 PROCEDURE:  EGD w/ biopsy ASA CLASS:     Class II INDICATIONS:  history of esophageal reflux and screening for Barrett's. MEDICATIONS: Monitored anesthesia care, Residual sedation present, and Propofol 150 mg IV TOPICAL ANESTHETIC: none DESCRIPTION OF PROCEDURE: After the risks benefits and alternatives of the procedure were thoroughly explained, informed consent was obtained.  The LB [id number] endoscope was introduced through the mouth and advanced to the second portion of the duodenum , Without limitations.  The instrument was slowly withdrawn as the mucosa was fully examined.     ESOPHAGUS:  The z line appeared variable and biopsies were taken. The esophagus otherwise appeared normal. STOMACH: The mucosa of the stomach appeared normal. DUODENUM: The duodenal mucosa showed no abnormalities.  Retroflexed views revealed a 4 cm hiatal hernia.     The scope was then withdrawn from the patient and the procedure completed.  COMPLICATIONS: There were no immediate complications.  ENDOSCOPIC IMPRESSION: 1.   Variable z-line; biopsied 2.    4 cm hiatal hernia 3.   The EGD appeared normal  RECOMMENDATIONS: 1.  Anti-reflux regimen long term 2.  Continue PPI daily 3.  Await pathology results  eSigned:  Ladene Artist, MD, [facility] 06/28/2015 9:45 AM

## 2015-06-28 NOTE — Op Note (Signed)
[place]  [address]. [place], [address]   COLONOSCOPY PROCEDURE REPORT  PATIENT: Alexa Morris, Alexa Morris  MR#: [id number] BIRTHDATE: [birth year] , 53  yrs. old GENDER: female ENDOSCOPIST: Ladene Artist, MD, [facility] REFERRED TK:WIOX Perini, M.D. PROCEDURE DATE:  06/28/2015 PROCEDURE:   Colonoscopy, screening First Screening Colonoscopy - Avg.  risk and is 50 yrs.  old or older - No.  Prior Negative Screening - Now for repeat screening. Less than 10 yrs Prior Negative Screening - Now for repeat screening.  Above average risk  History of Adenoma - Now for follow-up colonoscopy & has been > or = to 3 yrs.  N/A  Polyps removed today? No Recommend repeat exam, <10 yrs? Yes high risk ASA CLASS:   Class II INDICATIONS:Screening for colonic neoplasia and FH Colon or Rectal Adenocarcinoma. MEDICATIONS: Monitored anesthesia care and Propofol 175 mg IV DESCRIPTION OF PROCEDURE:   After the risks benefits and alternatives of the procedure were thoroughly explained, informed consent was obtained.  The digital rectal exam revealed no abnormalities of the rectum.   The LB [id number]  endoscope was introduced through the anus and advanced to the cecum, which was identified by both the appendix and ileocecal valve. No adverse events experienced with a tortuous colon.   The quality of the prep was good.  (Suprep was used)  The instrument was then slowly withdrawn as the colon was fully examined. Estimated blood loss is zero unless otherwise noted in this procedure report.    COLON FINDINGS: There was moderate diverticulosis noted in the sigmoid colon with associated tortuosity, colonic spasm and muscular hypertrophy.   The colonic mucosa appeared normal at the splenic flexure, in the transverse colon, rectum, descending colon, at the ileocecal valve, cecum, hepatic flexure, and in the ascending colon.  Retroflexed views revealed no abnormalities. The time to cecum = 6.0  Withdrawal time = 8.5   The scope was withdrawn and the procedure completed. COMPLICATIONS: There were no immediate complications.  ENDOSCOPIC IMPRESSION: 1.   Moderate diverticulosis noted in the sigmoid colon 2.   The colonic mucosa otherwise appeared normal  RECOMMENDATIONS: 1.  High fiber diet with liberal fluid intake. 2.  Repeat Colonoscopy in 5 years.  eSigned:  Ladene Artist, MD, [facility] 06/28/2015 9:35 AM

## 2015-06-28 NOTE — Progress Notes (Signed)
Transferred to recovery room. A/O x3, pleased with MAC.  VSS.  Report to Karen, RN. 

## 2015-06-28 NOTE — Progress Notes (Signed)
Called to room to assist during endoscopic procedure.  Patient ID and intended procedure confirmed with present staff. Received instructions for my participation in the procedure from the performing physician.  

## 2015-06-29 ENCOUNTER — Telehealth: Payer: Self-pay | Admitting: Gastroenterology

## 2015-06-29 ENCOUNTER — Telehealth: Payer: Self-pay | Admitting: *Deleted

## 2015-06-29 NOTE — Telephone Encounter (Signed)
Patient notified of biopsy locations.  All questions answered.  She will call back for any additional questions or concerns.

## 2015-06-29 NOTE — Telephone Encounter (Signed)
  Follow up Call-  Call back number 06/28/2015  Post procedure Call Back phone  # [phone]  Permission to leave phone message Yes     Patient questions:  Do you have a fever, pain , or abdominal swelling? No. Pain Score  0 *  Have you tolerated food without any problems? Yes.    Have you been able to return to your normal activities? Yes.    Do you have any questions about your discharge instructions: Diet   No. Medications  No. Follow up visit  No.  Do you have questions or concerns about your Care? No.  Actions: * If pain score is 4 or above: No action needed, pain <4.

## 2015-07-07 ENCOUNTER — Encounter: Payer: Self-pay | Admitting: Gastroenterology

## 2015-07-12 ENCOUNTER — Telehealth: Payer: Self-pay | Admitting: Gastroenterology

## 2015-07-12 NOTE — Telephone Encounter (Signed)
I asked patient if her reflux symptoms are under control takign Nexium 20 mg. Patient states they are under control. Told her to continue Nexium daily but just take it 30 mins before breakfast since is the best time to take this medication. Pt verbalized understanding. Pt would also like Barrett's info mailed to her. Information mailed.

## 2015-07-12 NOTE — Telephone Encounter (Signed)
Change Nexium to daily before a meal. If GERD symptoms not controlled then will increase dose.

## 2015-07-12 NOTE — Telephone Encounter (Signed)
Patient wanted to make sure Dr. Fuller Plan wants her to continue Nexium 20 mg daily at night. Patient wants to make sure she doesn't need to increase her dose or change to taking it at a different time of day. I informed patient the EGD report states to continue Nexium daily but I will verify with Dr. Fuller Plan. Please advise Dr. Fuller Plan.

## 2015-10-20 DIAGNOSIS — Z23 Encounter for immunization: Secondary | ICD-10-CM | POA: Diagnosis not present

## 2015-12-23 DIAGNOSIS — D225 Melanocytic nevi of trunk: Secondary | ICD-10-CM | POA: Diagnosis not present

## 2015-12-23 DIAGNOSIS — L218 Other seborrheic dermatitis: Secondary | ICD-10-CM | POA: Diagnosis not present

## 2015-12-23 DIAGNOSIS — D1801 Hemangioma of skin and subcutaneous tissue: Secondary | ICD-10-CM | POA: Diagnosis not present

## 2015-12-23 DIAGNOSIS — L739 Follicular disorder, unspecified: Secondary | ICD-10-CM | POA: Diagnosis not present

## 2015-12-23 DIAGNOSIS — L708 Other acne: Secondary | ICD-10-CM | POA: Diagnosis not present

## 2015-12-23 DIAGNOSIS — L853 Xerosis cutis: Secondary | ICD-10-CM | POA: Diagnosis not present

## 2015-12-27 ENCOUNTER — Other Ambulatory Visit: Payer: Self-pay

## 2015-12-27 DIAGNOSIS — Z1231 Encounter for screening mammogram for malignant neoplasm of breast: Secondary | ICD-10-CM

## 2015-12-28 ENCOUNTER — Ambulatory Visit
Admission: RE | Admit: 2015-12-28 | Discharge: 2015-12-28 | Disposition: A | Discharge status: Home / Self Care | Payer: Medicare Other | Source: Ambulatory Visit

## 2015-12-28 DIAGNOSIS — Z1231 Encounter for screening mammogram for malignant neoplasm of breast: Secondary | ICD-10-CM | POA: Diagnosis not present

## 2016-01-20 DIAGNOSIS — Z6825 Body mass index (BMI) 25.0-25.9, adult: Secondary | ICD-10-CM | POA: Diagnosis not present

## 2016-01-20 DIAGNOSIS — Z124 Encounter for screening for malignant neoplasm of cervix: Secondary | ICD-10-CM | POA: Diagnosis not present

## 2016-04-27 DIAGNOSIS — M810 Age-related osteoporosis without current pathological fracture: Secondary | ICD-10-CM | POA: Diagnosis not present

## 2016-05-18 DIAGNOSIS — H25813 Combined forms of age-related cataract, bilateral: Secondary | ICD-10-CM | POA: Diagnosis not present

## 2016-05-18 DIAGNOSIS — H43813 Vitreous degeneration, bilateral: Secondary | ICD-10-CM | POA: Diagnosis not present

## 2016-05-18 DIAGNOSIS — H16103 Unspecified superficial keratitis, bilateral: Secondary | ICD-10-CM | POA: Diagnosis not present

## 2016-05-18 DIAGNOSIS — H04123 Dry eye syndrome of bilateral lacrimal glands: Secondary | ICD-10-CM | POA: Diagnosis not present

## 2016-05-27 DIAGNOSIS — Z23 Encounter for immunization: Secondary | ICD-10-CM | POA: Diagnosis not present

## 2016-07-01 ENCOUNTER — Encounter (HOSPITAL_COMMUNITY): Payer: Self-pay | Admitting: Emergency Medicine

## 2016-07-01 ENCOUNTER — Ambulatory Visit (HOSPITAL_COMMUNITY)
Admission: EM | Admit: 2016-07-01 | Discharge: 2016-07-01 | Disposition: A | Discharge status: Home / Self Care | Payer: Medicare Other | Attending: Family Medicine | Admitting: Family Medicine

## 2016-07-01 DIAGNOSIS — L0291 Cutaneous abscess, unspecified: Secondary | ICD-10-CM

## 2016-07-01 DIAGNOSIS — K219 Gastro-esophageal reflux disease without esophagitis: Secondary | ICD-10-CM | POA: Insufficient documentation

## 2016-07-01 DIAGNOSIS — L0211 Cutaneous abscess of neck: Secondary | ICD-10-CM | POA: Insufficient documentation

## 2016-07-01 DIAGNOSIS — F17200 Nicotine dependence, unspecified, uncomplicated: Secondary | ICD-10-CM | POA: Diagnosis not present

## 2016-07-01 DIAGNOSIS — M8580 Other specified disorders of bone density and structure, unspecified site: Secondary | ICD-10-CM | POA: Diagnosis not present

## 2016-07-01 MED ORDER — SULFAMETHOXAZOLE-TRIMETHOPRIM 800-160 MG PO TABS: 1.0000 | ORAL_TABLET | Freq: Two times a day (BID) | ORAL | 0 refills | Status: AC

## 2016-07-01 NOTE — ED Triage Notes (Signed)
The patient presented to the [facility] with a complaint of a possible cyst on the right side of her neck. The patient stated that the cyst has been there "for some time" but has become inflamed and irritated over the last week. The patient reported that she has an upcoming dermatologist appointment as well for the same complaint.

## 2016-07-01 NOTE — ED Provider Notes (Signed)
CSN: [id number]     Arrival date & time 07/01/16  1404 History   First MD Initiated Contact with Patient 07/01/16 1531     Chief Complaint  Patient presents with  . Cyst   (Consider location/radiation/quality/duration/timing/severity/associated sxs/prior Treatment) 70 y.o. female presents with infection to abscess on the lateral side of right neck X. Condition is acute in nature. Condition is made better by nothing. Condition is made worse by nothing. Patient denies any relief from cleaning the affected area prior to there arrival at this facility. Patient states that she has an abscess to the area and is scheduled to see dermatology in 10 days for removal        Past Medical History:  Diagnosis Date  . GERD (gastroesophageal reflux disease)   . Osteopenia   . Seasonal allergies    Past Surgical History:  Procedure Laterality Date  . laparascopy    . TOE SURGERY     Family History  Problem Relation Age of Onset  . Kidney disease Father   . Diabetes Son   . Colon cancer Father   . Esophageal cancer Father   . GER disease Daughter    Social History  Substance Use Topics  . Smoking status: Former Research scientist (life sciences)  . Smokeless tobacco: Never Used  . Alcohol use 0.6 oz/week    1 Glasses of wine per week   OB History    No data available     Review of Systems  Constitutional: Negative.   Skin:       Abscess to right lateral neck     Allergies  Aspirin  Home Medications   Prior to Admission medications   Medication Sig Start Date End Date Taking? Authorizing Provider  esomeprazole (NEXIUM) 20 MG capsule Take 20 mg by mouth daily at 12 noon.   Yes Historical Provider, MD  ibandronate (BONIVA) 150 MG tablet Take 150 mg by mouth every 30 (thirty) days. Take in the morning with a full glass of water, on an empty stomach, and do not take anything else by mouth or lie down for the next 30 min.   Yes Historical Provider, MD  alendronate (FOSAMAX) 70 MG tablet Take 70 mg by mouth  once a week. Take with a full glass of water on an empty stomach.    Historical Provider, MD  atorvastatin (LIPITOR) 10 MG tablet Take 5 mg by mouth daily.    Historical Provider, MD  sulfamethoxazole-trimethoprim (BACTRIM DS,SEPTRA DS) 800-160 MG tablet Take 1 tablet by mouth 2 (two) times daily. 07/01/16 07/08/16  Jacqualine Mau, NP   Meds Ordered and Administered this Visit  Medications - No data to display  BP 99/69 (BP Location: Left Arm)   Pulse 84   Temp 98.1 F (36.7 C) (Oral)   Resp 18   SpO2 100%  No data found.   Physical Exam  Constitutional: She is oriented to person, place, and time. She appears well-developed and well-nourished.  HENT:  Head: Normocephalic and atraumatic.  Eyes: Conjunctivae are normal.  Neck: Normal range of motion.  Pulmonary/Chest: Effort normal.  Musculoskeletal: Normal range of motion.  Neurological: She is alert and oriented to person, place, and time.  Skin: Skin is warm.  Abscess to lateral right neck that is fluctuant approximately 5 cm  X 2,5 cm with erythema  Psychiatric: She has a normal mood and affect.  Nursing note and vitals reviewed.   Urgent Care Course   Clinical Course    .Marland KitchenIncision and  Drainage Date/Time: 07/01/2016 4:01 PM Performed by: Jacqualine Mau Authorized by: Jacqualine Mau   Consent:    Consent obtained:  Verbal   Consent given by:  Patient   Risks discussed:  Bleeding and damage to other organs   Alternatives discussed:  Delayed treatment Location:    Type:  Abscess Pre-procedure details:    Skin preparation:  Betadine Anesthesia (see MAR for exact dosages):    Anesthesia method:  None Procedure type:    Complexity:  Simple Procedure details:    Needle aspiration: no     Incision types:  Single straight   Incision depth:  Dermal   Scalpel blade:  11   Wound management:  Probed and deloculated   Drainage:  Purulent   Drainage amount:  Scant   Wound treatment:  Wound left  open   Packing materials:  None Post-procedure details:    Patient tolerance of procedure:  Tolerated well, no immediate complications   (including critical care time)  Labs Review Labs Reviewed  AEROBIC CULTURE (SUPERFICIAL SPECIMEN)    Imaging Review No results found.   Visual Acuity Review  Right Eye Distance:   Left Eye Distance:   Bilateral Distance:    Right Eye Near:   Left Eye Near:    Bilateral Near:         MDM   1. Abscess        Jacqualine Mau, NP 07/01/16 [phone]

## 2016-07-04 LAB — AEROBIC CULTURE  (SUPERFICIAL SPECIMEN)

## 2016-07-04 LAB — AEROBIC CULTURE W GRAM STAIN (SUPERFICIAL SPECIMEN): Culture: NORMAL

## 2016-07-11 DIAGNOSIS — M5412 Radiculopathy, cervical region: Secondary | ICD-10-CM | POA: Diagnosis not present

## 2016-07-11 DIAGNOSIS — M50322 Other cervical disc degeneration at C5-C6 level: Secondary | ICD-10-CM | POA: Diagnosis not present

## 2016-07-11 DIAGNOSIS — M9901 Segmental and somatic dysfunction of cervical region: Secondary | ICD-10-CM | POA: Diagnosis not present

## 2016-07-11 DIAGNOSIS — L723 Sebaceous cyst: Secondary | ICD-10-CM | POA: Diagnosis not present

## 2016-07-11 DIAGNOSIS — L089 Local infection of the skin and subcutaneous tissue, unspecified: Secondary | ICD-10-CM | POA: Diagnosis not present

## 2016-07-11 DIAGNOSIS — Z23 Encounter for immunization: Secondary | ICD-10-CM | POA: Diagnosis not present

## 2016-07-11 DIAGNOSIS — M9902 Segmental and somatic dysfunction of thoracic region: Secondary | ICD-10-CM | POA: Diagnosis not present

## 2016-07-12 DIAGNOSIS — M9901 Segmental and somatic dysfunction of cervical region: Secondary | ICD-10-CM | POA: Diagnosis not present

## 2016-07-12 DIAGNOSIS — M5412 Radiculopathy, cervical region: Secondary | ICD-10-CM | POA: Diagnosis not present

## 2016-07-12 DIAGNOSIS — M50322 Other cervical disc degeneration at C5-C6 level: Secondary | ICD-10-CM | POA: Diagnosis not present

## 2016-07-12 DIAGNOSIS — M9902 Segmental and somatic dysfunction of thoracic region: Secondary | ICD-10-CM | POA: Diagnosis not present

## 2016-07-13 DIAGNOSIS — M9902 Segmental and somatic dysfunction of thoracic region: Secondary | ICD-10-CM | POA: Diagnosis not present

## 2016-07-13 DIAGNOSIS — M5412 Radiculopathy, cervical region: Secondary | ICD-10-CM | POA: Diagnosis not present

## 2016-07-13 DIAGNOSIS — M9901 Segmental and somatic dysfunction of cervical region: Secondary | ICD-10-CM | POA: Diagnosis not present

## 2016-07-13 DIAGNOSIS — M50322 Other cervical disc degeneration at C5-C6 level: Secondary | ICD-10-CM | POA: Diagnosis not present

## 2016-07-17 DIAGNOSIS — M50322 Other cervical disc degeneration at C5-C6 level: Secondary | ICD-10-CM | POA: Diagnosis not present

## 2016-07-17 DIAGNOSIS — M9901 Segmental and somatic dysfunction of cervical region: Secondary | ICD-10-CM | POA: Diagnosis not present

## 2016-07-17 DIAGNOSIS — M5412 Radiculopathy, cervical region: Secondary | ICD-10-CM | POA: Diagnosis not present

## 2016-07-17 DIAGNOSIS — M9902 Segmental and somatic dysfunction of thoracic region: Secondary | ICD-10-CM | POA: Diagnosis not present

## 2016-07-18 DIAGNOSIS — M9902 Segmental and somatic dysfunction of thoracic region: Secondary | ICD-10-CM | POA: Diagnosis not present

## 2016-07-18 DIAGNOSIS — M5412 Radiculopathy, cervical region: Secondary | ICD-10-CM | POA: Diagnosis not present

## 2016-07-18 DIAGNOSIS — M9901 Segmental and somatic dysfunction of cervical region: Secondary | ICD-10-CM | POA: Diagnosis not present

## 2016-07-18 DIAGNOSIS — M810 Age-related osteoporosis without current pathological fracture: Secondary | ICD-10-CM | POA: Diagnosis not present

## 2016-07-18 DIAGNOSIS — E784 Other hyperlipidemia: Secondary | ICD-10-CM | POA: Diagnosis not present

## 2016-07-18 DIAGNOSIS — M50322 Other cervical disc degeneration at C5-C6 level: Secondary | ICD-10-CM | POA: Diagnosis not present

## 2016-07-20 DIAGNOSIS — M9901 Segmental and somatic dysfunction of cervical region: Secondary | ICD-10-CM | POA: Diagnosis not present

## 2016-07-20 DIAGNOSIS — M9902 Segmental and somatic dysfunction of thoracic region: Secondary | ICD-10-CM | POA: Diagnosis not present

## 2016-07-20 DIAGNOSIS — M5412 Radiculopathy, cervical region: Secondary | ICD-10-CM | POA: Diagnosis not present

## 2016-07-20 DIAGNOSIS — M50322 Other cervical disc degeneration at C5-C6 level: Secondary | ICD-10-CM | POA: Diagnosis not present

## 2016-07-24 DIAGNOSIS — M5412 Radiculopathy, cervical region: Secondary | ICD-10-CM | POA: Diagnosis not present

## 2016-07-24 DIAGNOSIS — M50322 Other cervical disc degeneration at C5-C6 level: Secondary | ICD-10-CM | POA: Diagnosis not present

## 2016-07-24 DIAGNOSIS — M9902 Segmental and somatic dysfunction of thoracic region: Secondary | ICD-10-CM | POA: Diagnosis not present

## 2016-07-24 DIAGNOSIS — M9901 Segmental and somatic dysfunction of cervical region: Secondary | ICD-10-CM | POA: Diagnosis not present

## 2016-07-25 DIAGNOSIS — M545 Low back pain: Secondary | ICD-10-CM | POA: Diagnosis not present

## 2016-07-25 DIAGNOSIS — A6000 Herpesviral infection of urogenital system, unspecified: Secondary | ICD-10-CM | POA: Diagnosis not present

## 2016-07-25 DIAGNOSIS — M50322 Other cervical disc degeneration at C5-C6 level: Secondary | ICD-10-CM | POA: Diagnosis not present

## 2016-07-25 DIAGNOSIS — M9902 Segmental and somatic dysfunction of thoracic region: Secondary | ICD-10-CM | POA: Diagnosis not present

## 2016-07-25 DIAGNOSIS — M9901 Segmental and somatic dysfunction of cervical region: Secondary | ICD-10-CM | POA: Diagnosis not present

## 2016-07-25 DIAGNOSIS — J3089 Other allergic rhinitis: Secondary | ICD-10-CM | POA: Diagnosis not present

## 2016-07-25 DIAGNOSIS — Z0000 Encounter for general adult medical examination without abnormal findings: Secondary | ICD-10-CM | POA: Diagnosis not present

## 2016-07-25 DIAGNOSIS — M5412 Radiculopathy, cervical region: Secondary | ICD-10-CM | POA: Diagnosis not present

## 2016-07-25 DIAGNOSIS — R87619 Unspecified abnormal cytological findings in specimens from cervix uteri: Secondary | ICD-10-CM | POA: Diagnosis not present

## 2016-07-25 DIAGNOSIS — Z6824 Body mass index (BMI) 24.0-24.9, adult: Secondary | ICD-10-CM | POA: Diagnosis not present

## 2016-07-25 DIAGNOSIS — Z1389 Encounter for screening for other disorder: Secondary | ICD-10-CM | POA: Diagnosis not present

## 2016-07-25 DIAGNOSIS — E784 Other hyperlipidemia: Secondary | ICD-10-CM | POA: Diagnosis not present

## 2016-07-25 DIAGNOSIS — M25551 Pain in right hip: Secondary | ICD-10-CM | POA: Diagnosis not present

## 2016-07-25 DIAGNOSIS — K219 Gastro-esophageal reflux disease without esophagitis: Secondary | ICD-10-CM | POA: Diagnosis not present

## 2016-07-25 DIAGNOSIS — M25552 Pain in left hip: Secondary | ICD-10-CM | POA: Diagnosis not present

## 2016-07-25 DIAGNOSIS — M810 Age-related osteoporosis without current pathological fracture: Secondary | ICD-10-CM | POA: Diagnosis not present

## 2016-07-27 DIAGNOSIS — M9902 Segmental and somatic dysfunction of thoracic region: Secondary | ICD-10-CM | POA: Diagnosis not present

## 2016-07-27 DIAGNOSIS — M50322 Other cervical disc degeneration at C5-C6 level: Secondary | ICD-10-CM | POA: Diagnosis not present

## 2016-07-27 DIAGNOSIS — M5412 Radiculopathy, cervical region: Secondary | ICD-10-CM | POA: Diagnosis not present

## 2016-07-27 DIAGNOSIS — M9901 Segmental and somatic dysfunction of cervical region: Secondary | ICD-10-CM | POA: Diagnosis not present

## 2016-07-31 DIAGNOSIS — M9902 Segmental and somatic dysfunction of thoracic region: Secondary | ICD-10-CM | POA: Diagnosis not present

## 2016-07-31 DIAGNOSIS — M9901 Segmental and somatic dysfunction of cervical region: Secondary | ICD-10-CM | POA: Diagnosis not present

## 2016-07-31 DIAGNOSIS — M5412 Radiculopathy, cervical region: Secondary | ICD-10-CM | POA: Diagnosis not present

## 2016-07-31 DIAGNOSIS — M50322 Other cervical disc degeneration at C5-C6 level: Secondary | ICD-10-CM | POA: Diagnosis not present

## 2016-08-01 DIAGNOSIS — M9901 Segmental and somatic dysfunction of cervical region: Secondary | ICD-10-CM | POA: Diagnosis not present

## 2016-08-01 DIAGNOSIS — M5412 Radiculopathy, cervical region: Secondary | ICD-10-CM | POA: Diagnosis not present

## 2016-08-01 DIAGNOSIS — M9902 Segmental and somatic dysfunction of thoracic region: Secondary | ICD-10-CM | POA: Diagnosis not present

## 2016-08-01 DIAGNOSIS — M50322 Other cervical disc degeneration at C5-C6 level: Secondary | ICD-10-CM | POA: Diagnosis not present

## 2016-08-07 DIAGNOSIS — M5412 Radiculopathy, cervical region: Secondary | ICD-10-CM | POA: Diagnosis not present

## 2016-08-07 DIAGNOSIS — M9901 Segmental and somatic dysfunction of cervical region: Secondary | ICD-10-CM | POA: Diagnosis not present

## 2016-08-07 DIAGNOSIS — M50322 Other cervical disc degeneration at C5-C6 level: Secondary | ICD-10-CM | POA: Diagnosis not present

## 2016-08-07 DIAGNOSIS — M9902 Segmental and somatic dysfunction of thoracic region: Secondary | ICD-10-CM | POA: Diagnosis not present

## 2016-08-07 DIAGNOSIS — L723 Sebaceous cyst: Secondary | ICD-10-CM | POA: Diagnosis not present

## 2016-08-09 DIAGNOSIS — L309 Dermatitis, unspecified: Secondary | ICD-10-CM | POA: Diagnosis not present

## 2016-08-11 DIAGNOSIS — M9902 Segmental and somatic dysfunction of thoracic region: Secondary | ICD-10-CM | POA: Diagnosis not present

## 2016-08-11 DIAGNOSIS — M50322 Other cervical disc degeneration at C5-C6 level: Secondary | ICD-10-CM | POA: Diagnosis not present

## 2016-08-11 DIAGNOSIS — M9901 Segmental and somatic dysfunction of cervical region: Secondary | ICD-10-CM | POA: Diagnosis not present

## 2016-08-11 DIAGNOSIS — M5412 Radiculopathy, cervical region: Secondary | ICD-10-CM | POA: Diagnosis not present

## 2016-08-15 DIAGNOSIS — M9902 Segmental and somatic dysfunction of thoracic region: Secondary | ICD-10-CM | POA: Diagnosis not present

## 2016-08-15 DIAGNOSIS — M5412 Radiculopathy, cervical region: Secondary | ICD-10-CM | POA: Diagnosis not present

## 2016-08-15 DIAGNOSIS — M50322 Other cervical disc degeneration at C5-C6 level: Secondary | ICD-10-CM | POA: Diagnosis not present

## 2016-08-15 DIAGNOSIS — M9901 Segmental and somatic dysfunction of cervical region: Secondary | ICD-10-CM | POA: Diagnosis not present

## 2016-08-17 DIAGNOSIS — M9902 Segmental and somatic dysfunction of thoracic region: Secondary | ICD-10-CM | POA: Diagnosis not present

## 2016-08-17 DIAGNOSIS — M9901 Segmental and somatic dysfunction of cervical region: Secondary | ICD-10-CM | POA: Diagnosis not present

## 2016-08-17 DIAGNOSIS — M5412 Radiculopathy, cervical region: Secondary | ICD-10-CM | POA: Diagnosis not present

## 2016-08-17 DIAGNOSIS — M50322 Other cervical disc degeneration at C5-C6 level: Secondary | ICD-10-CM | POA: Diagnosis not present

## 2016-08-24 DIAGNOSIS — M5412 Radiculopathy, cervical region: Secondary | ICD-10-CM | POA: Diagnosis not present

## 2016-08-24 DIAGNOSIS — M9902 Segmental and somatic dysfunction of thoracic region: Secondary | ICD-10-CM | POA: Diagnosis not present

## 2016-08-24 DIAGNOSIS — M9901 Segmental and somatic dysfunction of cervical region: Secondary | ICD-10-CM | POA: Diagnosis not present

## 2016-08-24 DIAGNOSIS — M50322 Other cervical disc degeneration at C5-C6 level: Secondary | ICD-10-CM | POA: Diagnosis not present

## 2016-09-07 DIAGNOSIS — M9901 Segmental and somatic dysfunction of cervical region: Secondary | ICD-10-CM | POA: Diagnosis not present

## 2016-09-07 DIAGNOSIS — M50322 Other cervical disc degeneration at C5-C6 level: Secondary | ICD-10-CM | POA: Diagnosis not present

## 2016-09-07 DIAGNOSIS — M5412 Radiculopathy, cervical region: Secondary | ICD-10-CM | POA: Diagnosis not present

## 2016-09-07 DIAGNOSIS — M9902 Segmental and somatic dysfunction of thoracic region: Secondary | ICD-10-CM | POA: Diagnosis not present

## 2017-01-31 ENCOUNTER — Other Ambulatory Visit: Payer: Self-pay | Admitting: Internal Medicine

## 2017-01-31 DIAGNOSIS — Z1231 Encounter for screening mammogram for malignant neoplasm of breast: Secondary | ICD-10-CM

## 2017-02-15 ENCOUNTER — Ambulatory Visit
Admission: RE | Admit: 2017-02-15 | Discharge: 2017-02-15 | Disposition: A | Discharge status: Home / Self Care | Payer: Medicare Other | Source: Ambulatory Visit | Attending: Internal Medicine | Admitting: Internal Medicine

## 2017-02-15 DIAGNOSIS — Z1231 Encounter for screening mammogram for malignant neoplasm of breast: Secondary | ICD-10-CM

## 2018-01-07 ENCOUNTER — Other Ambulatory Visit: Payer: Self-pay | Admitting: Internal Medicine

## 2018-01-07 DIAGNOSIS — Z1231 Encounter for screening mammogram for malignant neoplasm of breast: Secondary | ICD-10-CM

## 2018-02-21 ENCOUNTER — Ambulatory Visit
Admission: RE | Admit: 2018-02-21 | Discharge: 2018-02-21 | Disposition: A | Discharge status: Home / Self Care | Payer: Medicare Other | Source: Ambulatory Visit | Attending: Internal Medicine | Admitting: Internal Medicine

## 2018-02-21 DIAGNOSIS — Z1231 Encounter for screening mammogram for malignant neoplasm of breast: Secondary | ICD-10-CM

## 2018-07-08 ENCOUNTER — Encounter: Payer: Self-pay | Admitting: General Practice

## 2018-07-24 ENCOUNTER — Encounter: Payer: Self-pay | Admitting: Gastroenterology

## 2018-08-21 ENCOUNTER — Ambulatory Visit (AMBULATORY_SURGERY_CENTER): Payer: Self-pay

## 2018-08-21 VITALS — Ht 62.5 in | Wt 133.8 lb

## 2018-08-21 DIAGNOSIS — Z800 Family history of malignant neoplasm of digestive organs: Secondary | ICD-10-CM

## 2018-08-21 DIAGNOSIS — K2270 Barrett's esophagus without dysplasia: Secondary | ICD-10-CM

## 2018-08-21 NOTE — Progress Notes (Signed)
Per pt, no allergies to soy or egg products.Pt not taking any weight loss meds or using  O2 at home.  Pt refused emmi video. 

## 2018-08-29 ENCOUNTER — Encounter: Payer: Self-pay | Admitting: Gastroenterology

## 2018-09-02 ENCOUNTER — Encounter: Payer: Medicare Other | Admitting: Gastroenterology

## 2018-09-12 ENCOUNTER — Ambulatory Visit (AMBULATORY_SURGERY_CENTER): Payer: Medicare Other | Admitting: Gastroenterology

## 2018-09-12 ENCOUNTER — Telehealth: Payer: Self-pay | Admitting: Gastroenterology

## 2018-09-12 ENCOUNTER — Encounter: Payer: Self-pay | Admitting: Gastroenterology

## 2018-09-12 VITALS — BP 141/76 | HR 53 | Temp 98.4°F | Resp 18 | Ht 62.0 in | Wt 128.0 lb

## 2018-09-12 DIAGNOSIS — K208 Other esophagitis: Secondary | ICD-10-CM | POA: Diagnosis not present

## 2018-09-12 DIAGNOSIS — K2960 Other gastritis without bleeding: Secondary | ICD-10-CM

## 2018-09-12 DIAGNOSIS — K449 Diaphragmatic hernia without obstruction or gangrene: Secondary | ICD-10-CM

## 2018-09-12 DIAGNOSIS — K2950 Unspecified chronic gastritis without bleeding: Secondary | ICD-10-CM

## 2018-09-12 DIAGNOSIS — K2270 Barrett's esophagus without dysplasia: Secondary | ICD-10-CM

## 2018-09-12 NOTE — Progress Notes (Signed)
Called to room to assist during endoscopic procedure.  Patient ID and intended procedure confirmed with present staff. Received instructions for my participation in the procedure from the performing physician.  

## 2018-09-12 NOTE — Patient Instructions (Addendum)
Impression/Recommendations:  Hiatal hernia handout given to patient. Barrett's esophagus handout given to patient. Gastritis handout given to patient.  Resume previous diet. Continue present medications.  Repeat upper endoscopy in 3 years for surveillance if no dysplasia.  YOU HAD AN ENDOSCOPIC PROCEDURE TODAY AT [facility]:   Refer to the procedure report that was given to you for any specific questions about what was found during the examination.  If the procedure report does not answer your questions, please call your gastroenterologist to clarify.  If you requested that your care partner not be given the details of your procedure findings, then the procedure report has been included in a sealed envelope for you to review at your convenience later.  YOU SHOULD EXPECT: Some feelings of bloating in the abdomen. Passage of more gas than usual.  Walking can help get rid of the air that was put into your GI tract during the procedure and reduce the bloating. If you had a lower endoscopy (such as a colonoscopy or flexible sigmoidoscopy) you may notice spotting of blood in your stool or on the toilet paper. If you underwent a bowel prep for your procedure, you may not have a normal bowel movement for a few days.  Please Note:  You might notice some irritation and congestion in your nose or some drainage.  This is from the oxygen used during your procedure.  There is no need for concern and it should clear up in a day or so.  SYMPTOMS TO REPORT IMMEDIATELY:  Following upper endoscopy (EGD)  Vomiting of blood or coffee ground material  New chest pain or pain under the shoulder blades  Painful or persistently difficult swallowing  New shortness of breath  Fever of 100F or higher  Black, tarry-looking stools  For urgent or emergent issues, a gastroenterologist can be reached at any hour by calling [phone].   DIET:  We do recommend a small meal at first, but then you  may proceed to your regular diet.  Drink plenty of fluids but you should avoid alcoholic beverages for 24 hours.  ACTIVITY:  You should plan to take it easy for the rest of today and you should NOT DRIVE or use heavy machinery until tomorrow (because of the sedation medicines used during the test).    FOLLOW UP: Our staff will call the number listed on your records the next business day following your procedure to check on you and address any questions or concerns that you may have regarding the information given to you following your procedure. If we do not reach you, we will leave a message.  However, if you are feeling well and you are not experiencing any problems, there is no need to return our call.  We will assume that you have returned to your regular daily activities without incident.  If any biopsies were taken you will be contacted by phone or by letter within the next 1-3 weeks.  Please call us at [phone] if you have not heard about the biopsies in 3 weeks.    SIGNATURES/CONFIDENTIALITY: You and/or your care partner have signed paperwork which will be entered into your electronic medical record.  These signatures attest to the fact that that the information above on your After Visit Summary has been reviewed and is understood.  Full responsibility of the confidentiality of this discharge information lies with you and/or your care-partner.

## 2018-09-12 NOTE — Progress Notes (Signed)
Pt's states no medical or surgical changes since previsit or office visit. 

## 2018-09-12 NOTE — Progress Notes (Signed)
PT taken to PACU. Monitors in place. VSS. Report given to RN. 

## 2018-09-12 NOTE — Op Note (Signed)
[place] Patient Name: Alexa Morris Procedure Date: 09/12/2018 1:17 PM MRN: [id number] Endoscopist: Ladene Artist , MD Age: 73 Referring MD:  Date of Birth: 13-Oct-1945 Gender: Female Account #: [id number] Procedure:                Upper GI endoscopy Indications:              Surveillance for malignancy due to personal history                            of Barrett's esophagus Medicines:                Monitored Anesthesia Care Procedure:                Pre-Anesthesia Assessment:                           - Prior to the procedure, a History and Physical                            was performed, and patient medications and                            allergies were reviewed. The patient's tolerance of                            previous anesthesia was also reviewed. The risks                            and benefits of the procedure and the sedation                            options and risks were discussed with the patient.                            All questions were answered, and informed consent                            was obtained. Prior Anticoagulants: The patient has                            taken no previous anticoagulant or antiplatelet                            agents. ASA Grade Assessment: II - A patient with                            mild systemic disease. After reviewing the risks                            and benefits, the patient was deemed in                            satisfactory condition to undergo the procedure.  After obtaining informed consent, the endoscope was                            passed under direct vision. Throughout the                            procedure, the patient's blood pressure, pulse, and                            oxygen saturations were monitored continuously. The                            Model [id number] scope was introduced                            through the mouth, and  advanced to the second part                            of duodenum. The upper GI endoscopy was                            accomplished without difficulty. The patient                            tolerated the procedure well. Scope In: Scope Out: Findings:                 There were esophageal mucosal changes secondary to                            established short-segment Barrett's disease present                            in the distal esophagus. The maximum longitudinal                            extent of these mucosal changes was 1 cm in length.                            Z-line at 38 cm. Mucosa was biopsied with a cold                            forceps for histology. One specimen bottle was sent                            to pathology.                           The exam of the esophagus was otherwise normal.                           A small hiatal hernia was present.                           Patchy mild  inflammation characterized by erythema                            and granularity was found in the gastric body and                            in the gastric antrum. Biopsies were taken with a                            cold forceps for histology.                           The exam of the stomach was otherwise normal.                           The duodenal bulb and second portion of the                            duodenum were normal. Complications:            No immediate complications. Estimated Blood Loss:     Estimated blood loss was minimal. Impression:               - Esophageal mucosal changes secondary to                            established short-segment Barrett's disease.                            Biopsied.                           - Small hiatal hernia.                           - Gastritis. Biopsied.                           - Normal duodenal bulb and second portion of the                            duodenum. Recommendation:           - Patient has a contact  number available for                            emergencies. The signs and symptoms of potential                            delayed complications were discussed with the                            patient. Return to normal activities tomorrow.                            Written discharge instructions were provided to the  patient.                           - Resume previous diet.                           - Continue present medications.                           - Await pathology results.                           - Repeat upper endoscopy in 3 years for                            surveillance if no dysplasia. Ladene Artist, MD 09/12/2018 1:46:57 PM This report has been signed electronically.

## 2018-09-13 ENCOUNTER — Telehealth: Payer: Self-pay

## 2018-09-13 NOTE — Telephone Encounter (Signed)
Patient states she is under the impression that Nexium is a short term medication and she needs something long term to treat gastritis. Informed patient that Nexium is a long term medication and treats gastritis and Dr. Fuller Plan wants her to continue her current medications according to his procedure note. Patient states she will stay on Nexium then.

## 2018-09-13 NOTE — Telephone Encounter (Signed)
  Follow up Call-  Call back number 09/12/2018  Post procedure Call Back phone  # [phone]  Permission to leave phone message Yes  Some recent data might be hidden     Patient questions:  Do you have a fever, pain , or abdominal swelling? No. Pain Score  0 *  Have you tolerated food without any problems? Yes.    Have you been able to return to your normal activities? Yes.    Do you have any questions about your discharge instructions: Diet   No. Medications  No. Follow up visit  No.  Do you have questions or concerns about your Care? No.  Actions: * If pain score is 4 or above: No action needed, pain <4.

## 2018-09-17 ENCOUNTER — Encounter: Payer: Self-pay | Admitting: Gastroenterology

## 2019-10-01 ENCOUNTER — Ambulatory Visit: Payer: Medicare PPO | Attending: Internal Medicine

## 2019-10-01 DIAGNOSIS — Z23 Encounter for immunization: Secondary | ICD-10-CM | POA: Insufficient documentation

## 2019-10-01 NOTE — Progress Notes (Signed)
   Covid-19 Vaccination Clinic  Name:  Alexa Morris    MRN: [id number] DOB: 1946/07/10  10/01/2019  [name] was observed post Covid-19 immunization for 15 minutes without incidence. She was provided with Vaccine Information Sheet and instruction to access the V-Safe system.   [name] was instructed to call 911 with any severe reactions post vaccine: Marland Kitchen Difficulty breathing  . Swelling of your face and throat  . A fast heartbeat  . A bad rash all over your body  . Dizziness and weakness    Immunizations Administered    Name Date Dose VIS Date Route   Pfizer COVID-19 Vaccine 10/01/2019  6:18 PM 0.3 mL 08/22/2019 Intramuscular   Manufacturer: [place]   Lot: [id number]   [place]: [id number]

## 2019-10-22 ENCOUNTER — Ambulatory Visit: Payer: Medicare PPO | Attending: Internal Medicine

## 2019-10-22 DIAGNOSIS — Z23 Encounter for immunization: Secondary | ICD-10-CM | POA: Insufficient documentation

## 2019-10-22 NOTE — Progress Notes (Signed)
   Covid-19 Vaccination Clinic  Name:  Alexa Morris    MRN: [id number] DOB: 06/29/1946  10/22/2019  [name] was observed post Covid-19 immunization for 15 minutes without incidence. She was provided with Vaccine Information Sheet and instruction to access the V-Safe system.   [name] was instructed to call 911 with any severe reactions post vaccine: Marland Kitchen Difficulty breathing  . Swelling of your face and throat  . A fast heartbeat  . A bad rash all over your body  . Dizziness and weakness    Immunizations Administered    Name Date Dose VIS Date Route   Pfizer COVID-19 Vaccine 10/22/2019  8:17 AM 0.3 mL 08/22/2019 Intramuscular   Manufacturer: [place]   Lot: [id number]   [place]: [id number]

## 2019-10-23 ENCOUNTER — Ambulatory Visit: Payer: Medicare PPO

## 2019-11-18 ENCOUNTER — Other Ambulatory Visit: Payer: Self-pay | Admitting: Internal Medicine

## 2019-11-18 DIAGNOSIS — E785 Hyperlipidemia, unspecified: Secondary | ICD-10-CM

## 2019-12-01 ENCOUNTER — Ambulatory Visit
Admission: RE | Admit: 2019-12-01 | Discharge: 2019-12-01 | Disposition: A | Discharge status: Home / Self Care | Payer: No Typology Code available for payment source | Source: Ambulatory Visit | Attending: Internal Medicine | Admitting: Internal Medicine

## 2019-12-01 DIAGNOSIS — E785 Hyperlipidemia, unspecified: Secondary | ICD-10-CM

## 2020-02-23 ENCOUNTER — Other Ambulatory Visit: Payer: Self-pay | Admitting: Internal Medicine

## 2020-02-23 DIAGNOSIS — Z1231 Encounter for screening mammogram for malignant neoplasm of breast: Secondary | ICD-10-CM

## 2020-02-24 ENCOUNTER — Ambulatory Visit
Admission: RE | Admit: 2020-02-24 | Discharge: 2020-02-24 | Disposition: A | Discharge status: Home / Self Care | Payer: Medicare PPO | Source: Ambulatory Visit | Attending: Internal Medicine | Admitting: Internal Medicine

## 2020-02-24 ENCOUNTER — Other Ambulatory Visit: Payer: Self-pay

## 2020-02-24 DIAGNOSIS — Z1231 Encounter for screening mammogram for malignant neoplasm of breast: Secondary | ICD-10-CM | POA: Diagnosis not present

## 2020-06-11 ENCOUNTER — Ambulatory Visit: Payer: Medicare PPO

## 2020-06-12 DIAGNOSIS — Z23 Encounter for immunization: Secondary | ICD-10-CM | POA: Diagnosis not present

## 2020-09-13 DIAGNOSIS — H04123 Dry eye syndrome of bilateral lacrimal glands: Secondary | ICD-10-CM | POA: Diagnosis not present

## 2020-09-13 DIAGNOSIS — H524 Presbyopia: Secondary | ICD-10-CM | POA: Diagnosis not present

## 2020-09-13 DIAGNOSIS — H01001 Unspecified blepharitis right upper eyelid: Secondary | ICD-10-CM | POA: Diagnosis not present

## 2020-09-13 DIAGNOSIS — Z961 Presence of intraocular lens: Secondary | ICD-10-CM | POA: Diagnosis not present

## 2020-11-17 ENCOUNTER — Telehealth: Payer: Self-pay | Admitting: Gastroenterology

## 2020-11-17 NOTE — Telephone Encounter (Signed)
OK to proceed with colonoscopy (FHx colon cancer) and EGD (Barretts) this year.

## 2020-11-17 NOTE — Telephone Encounter (Signed)
Dr. Fuller Plan, patient was due for recall colonoscopy in 2021 and is due for recall EGD om 09/2021.  She would like to schedule both this year.  Okay to schedule both now?

## 2020-11-23 DIAGNOSIS — M810 Age-related osteoporosis without current pathological fracture: Secondary | ICD-10-CM | POA: Diagnosis not present

## 2020-11-23 DIAGNOSIS — M25559 Pain in unspecified hip: Secondary | ICD-10-CM | POA: Diagnosis not present

## 2020-11-23 DIAGNOSIS — E785 Hyperlipidemia, unspecified: Secondary | ICD-10-CM | POA: Diagnosis not present

## 2020-11-25 ENCOUNTER — Other Ambulatory Visit: Payer: Self-pay

## 2020-11-25 ENCOUNTER — Ambulatory Visit (AMBULATORY_SURGERY_CENTER): Payer: Self-pay

## 2020-11-25 VITALS — Ht 62.5 in | Wt 140.6 lb

## 2020-11-25 DIAGNOSIS — Z800 Family history of malignant neoplasm of digestive organs: Secondary | ICD-10-CM

## 2020-11-25 MED ORDER — NA SULFATE-K SULFATE-MG SULF 17.5-3.13-1.6 GM/177ML PO SOLN: 1.0000 | Freq: Once | ORAL | 0 refills | Status: AC

## 2020-11-25 NOTE — Progress Notes (Signed)
No egg or soy allergy known to patient  No issues with sedation at most previous colonoscopy Patient denies ever being told they had issues or difficulty with intubation  No FH of Malignant Hyperthermia No diet pills per patient No home 02 use per patient  No blood thinners per patient  Pt denies issues with constipation  No A fib or A flutter  EMMI video to pt or via [place] 19 guidelines implemented in [place] today with Pt and RN  Pt is fully vaccinated  for Covid   Due to the COVID-19 pandemic we are asking patients to follow certain guidelines.  Pt aware of COVID protocols and [facility] guidelines

## 2020-11-30 DIAGNOSIS — R896 Abnormal cytological findings in specimens from other organs, systems and tissues: Secondary | ICD-10-CM | POA: Diagnosis not present

## 2020-11-30 DIAGNOSIS — R87619 Unspecified abnormal cytological findings in specimens from cervix uteri: Secondary | ICD-10-CM | POA: Diagnosis not present

## 2020-11-30 DIAGNOSIS — I700 Atherosclerosis of aorta: Secondary | ICD-10-CM | POA: Diagnosis not present

## 2020-11-30 DIAGNOSIS — R82998 Other abnormal findings in urine: Secondary | ICD-10-CM | POA: Diagnosis not present

## 2020-11-30 DIAGNOSIS — R140 Abdominal distension (gaseous): Secondary | ICD-10-CM | POA: Diagnosis not present

## 2020-11-30 DIAGNOSIS — E785 Hyperlipidemia, unspecified: Secondary | ICD-10-CM | POA: Diagnosis not present

## 2020-11-30 DIAGNOSIS — Z0000 Encounter for general adult medical examination without abnormal findings: Secondary | ICD-10-CM | POA: Diagnosis not present

## 2020-11-30 DIAGNOSIS — M810 Age-related osteoporosis without current pathological fracture: Secondary | ICD-10-CM | POA: Diagnosis not present

## 2020-11-30 DIAGNOSIS — K2270 Barrett's esophagus without dysplasia: Secondary | ICD-10-CM | POA: Diagnosis not present

## 2020-12-02 ENCOUNTER — Encounter: Payer: Self-pay | Admitting: Gastroenterology

## 2020-12-08 ENCOUNTER — Encounter: Payer: Self-pay | Admitting: Gastroenterology

## 2020-12-08 ENCOUNTER — Ambulatory Visit (AMBULATORY_SURGERY_CENTER): Payer: Medicare PPO | Admitting: Gastroenterology

## 2020-12-08 ENCOUNTER — Other Ambulatory Visit: Payer: Self-pay

## 2020-12-08 ENCOUNTER — Encounter: Payer: Medicare PPO | Admitting: Gastroenterology

## 2020-12-08 VITALS — BP 96/67 | HR 60 | Temp 98.0°F | Resp 10 | Ht 62.0 in | Wt 140.6 lb

## 2020-12-08 DIAGNOSIS — Z800 Family history of malignant neoplasm of digestive organs: Secondary | ICD-10-CM

## 2020-12-08 DIAGNOSIS — E7800 Pure hypercholesterolemia, unspecified: Secondary | ICD-10-CM | POA: Diagnosis not present

## 2020-12-08 DIAGNOSIS — Z86010 Personal history of colonic polyps: Secondary | ICD-10-CM | POA: Diagnosis not present

## 2020-12-08 MED ORDER — SODIUM CHLORIDE 0.9 % IV SOLN: 500.0000 mL | Freq: Once | INTRAVENOUS | Status: DC

## 2020-12-08 NOTE — Op Note (Signed)
[place] Patient Name: Alexa Morris Procedure Date: 12/08/2020 8:43 AM MRN: [id number] Endoscopist: Ladene Artist , MD Age: 75 Referring MD:  Date of Birth: 1946-01-02 Gender: Female Account #: [id number] Procedure:                Colonoscopy Indications:              Screening in patient at increased risk: Family                            history of 1st-degree relative with colorectal                            cancer Medicines:                Monitored Anesthesia Care Procedure:                Pre-Anesthesia Assessment:                           - Prior to the procedure, a History and Physical                            was performed, and patient medications and                            allergies were reviewed. The patient's tolerance of                            previous anesthesia was also reviewed. The risks                            and benefits of the procedure and the sedation                            options and risks were discussed with the patient.                            All questions were answered, and informed consent                            was obtained. Prior Anticoagulants: The patient has                            taken no previous anticoagulant or antiplatelet                            agents. ASA Grade Assessment: II - A patient with                            mild systemic disease. After reviewing the risks                            and benefits, the patient was deemed in  satisfactory condition to undergo the procedure.                           After obtaining informed consent, the colonoscope                            was passed under direct vision. Throughout the                            procedure, the patient's blood pressure, pulse, and                            oxygen saturations were monitored continuously. The                            Olympus PFC-[id number]) Colonoscope was                             introduced through the anus and advanced to the the                            cecum, identified by appendiceal orifice and                            ileocecal valve. The ileocecal valve, appendiceal                            orifice, and rectum were photographed. The quality                            of the bowel preparation was good. The colonoscopy                            was performed without difficulty. The patient                            tolerated the procedure well. Scope In: 8:55:40 AM Scope Out: 9:12:11 AM Scope Withdrawal Time: 0 hours 9 minutes 56 seconds  Total Procedure Duration: 0 hours 16 minutes 31 seconds  Findings:                 The perianal and digital rectal examinations were                            normal.                           Multiple small-mouthed diverticula were found in                            the left colon. There was narrowing of the colon in                            association with the diverticular opening.  Peri-diverticular erythema was seen. There was no                            evidence of diverticular bleeding.                           Internal hemorrhoids were found during                            retroflexion. The hemorrhoids were small and Grade                            I (internal hemorrhoids that do not prolapse).                           The exam was otherwise without abnormality on                            direct and retroflexion views. Complications:            No immediate complications. Estimated blood loss:                            None. Estimated Blood Loss:     Estimated blood loss: none. Impression:               - Moderate diverticulosis in the left colon.                           - Internal hemorrhoids.                           - The examination was otherwise normal on direct                            and retroflexion views.                           - No  specimens collected. Recommendation:           - Consider repeat colonoscopy in 5 years for                            screening purposes vs no repeat due to age.                           - Patient has a contact number available for                            emergencies. The signs and symptoms of potential                            delayed complications were discussed with the                            patient. Return to normal activities tomorrow.  Written discharge instructions were provided to the                            patient.                           - High fiber diet.                           - Continue present medications. Ladene Artist, MD 12/08/2020 9:15:52 AM This report has been signed electronically.

## 2020-12-08 NOTE — Patient Instructions (Signed)
YOU HAD AN ENDOSCOPIC PROCEDURE TODAY AT THE [facility]:   Refer to the procedure report that was given to you for any specific questions about what was found during the examination.  If the procedure report does not answer your questions, please call your gastroenterologist to clarify.  If you requested that your care partner not be given the details of your procedure findings, then the procedure report has been included in a sealed envelope for you to review at your convenience later.  YOU SHOULD EXPECT: Some feelings of bloating in the abdomen. Passage of more gas than usual.  Walking can help get rid of the air that was put into your GI tract during the procedure and reduce the bloating. If you had a lower endoscopy (such as a colonoscopy or flexible sigmoidoscopy) you may notice spotting of blood in your stool or on the toilet paper. If you underwent a bowel prep for your procedure, you may not have a normal bowel movement for a few days.  Please Note:  You might notice some irritation and congestion in your nose or some drainage.  This is from the oxygen used during your procedure.  There is no need for concern and it should clear up in a day or so.  SYMPTOMS TO REPORT IMMEDIATELY:   Following lower endoscopy (colonoscopy or flexible sigmoidoscopy):  Excessive amounts of blood in the stool  Significant tenderness or worsening of abdominal pains  Swelling of the abdomen that is new, acute  Fever of 100F or higher   Following upper endoscopy (EGD)  Vomiting of blood or coffee ground material  New chest pain or pain under the shoulder blades  Painful or persistently difficult swallowing  New shortness of breath  Fever of 100F or higher  Black, tarry-looking stools  For urgent or emergent issues, a gastroenterologist can be reached at any hour by calling [phone]. Do not use MyChart messaging for urgent concerns.    DIET:  We do recommend a small meal at first, but  then you may proceed to your regular diet.  Drink plenty of fluids but you should avoid alcoholic beverages for 24 hours.  ACTIVITY:  You should plan to take it easy for the rest of today and you should NOT DRIVE or use heavy machinery until tomorrow (because of the sedation medicines used during the test).    FOLLOW UP: Our staff will call the number listed on your records 48-72 hours following your procedure to check on you and address any questions or concerns that you may have regarding the information given to you following your procedure. If we do not reach you, we will leave a message.  We will attempt to reach you two times.  During this call, we will ask if you have developed any symptoms of COVID 19. If you develop any symptoms (ie: fever, flu-like symptoms, shortness of breath, cough etc.) before then, please call [phone].  If you test positive for Covid 19 in the 2 weeks post procedure, please call and report this information to us.    If any biopsies were taken you will be contacted by phone or by letter within the next 1-3 weeks.  Please call us at [phone] if you have not heard about the biopsies in 3 weeks.    SIGNATURES/CONFIDENTIALITY: You and/or your care partner have signed paperwork which will be entered into your electronic medical record.  These signatures attest to the fact that that the information above on   your After Visit Summary has been reviewed and is understood.  Full responsibility of the confidentiality of this discharge information lies with you and/or your care-partner. 

## 2020-12-08 NOTE — Progress Notes (Signed)
Report given to PACU, vss 

## 2020-12-08 NOTE — Progress Notes (Signed)
Pt's states no medical or surgical changes since previsit or office visit.  CW vitals and SH IV.

## 2020-12-10 ENCOUNTER — Telehealth: Payer: Self-pay | Admitting: *Deleted

## 2020-12-10 NOTE — Telephone Encounter (Signed)
  Follow up Call-  Call back number 12/08/2020 09/12/2018  Post procedure Call Back phone  # [phone]  Permission to leave phone message Yes Yes  Some recent data might be hidden     Patient questions:  Do you have a fever, pain , or abdominal swelling? No. Pain Score  0 *  Have you tolerated food without any problems? Yes.    Have you been able to return to your normal activities? Yes.    Do you have any questions about your discharge instructions: Diet   No. Medications  No. Follow up visit  No.  Do you have questions or concerns about your Care? No.  Actions: * If pain score is 4 or above: 1. No action needed, pain <4.Have you developed a fever since your procedure? no  2.   Have you had an respiratory symptoms (SOB or cough) since your procedure? no  3.   Have you tested positive for COVID 19 since your procedure no  4.   Have you had any family members/close contacts diagnosed with the COVID 19 since your procedure?  no   If yes to any of these questions please route to Joylene John, RN and Joella Prince, RN

## 2020-12-14 DIAGNOSIS — Z6825 Body mass index (BMI) 25.0-25.9, adult: Secondary | ICD-10-CM | POA: Diagnosis not present

## 2020-12-14 DIAGNOSIS — R87619 Unspecified abnormal cytological findings in specimens from cervix uteri: Secondary | ICD-10-CM | POA: Diagnosis not present

## 2020-12-14 DIAGNOSIS — Z124 Encounter for screening for malignant neoplasm of cervix: Secondary | ICD-10-CM | POA: Diagnosis not present

## 2020-12-14 DIAGNOSIS — Z1151 Encounter for screening for human papillomavirus (HPV): Secondary | ICD-10-CM | POA: Diagnosis not present

## 2020-12-14 DIAGNOSIS — Z01411 Encounter for gynecological examination (general) (routine) with abnormal findings: Secondary | ICD-10-CM | POA: Diagnosis not present

## 2020-12-14 DIAGNOSIS — Z01419 Encounter for gynecological examination (general) (routine) without abnormal findings: Secondary | ICD-10-CM | POA: Diagnosis not present

## 2021-04-19 DIAGNOSIS — L719 Rosacea, unspecified: Secondary | ICD-10-CM | POA: Diagnosis not present

## 2021-04-19 DIAGNOSIS — L578 Other skin changes due to chronic exposure to nonionizing radiation: Secondary | ICD-10-CM | POA: Diagnosis not present

## 2021-04-19 DIAGNOSIS — L814 Other melanin hyperpigmentation: Secondary | ICD-10-CM | POA: Diagnosis not present

## 2021-04-19 DIAGNOSIS — R238 Other skin changes: Secondary | ICD-10-CM | POA: Diagnosis not present

## 2021-04-19 DIAGNOSIS — L821 Other seborrheic keratosis: Secondary | ICD-10-CM | POA: Diagnosis not present

## 2021-04-25 DIAGNOSIS — L237 Allergic contact dermatitis due to plants, except food: Secondary | ICD-10-CM | POA: Diagnosis not present

## 2021-06-10 IMAGING — CT CT CARDIAC CORONARY ARTERY CALCIUM SCORE
3 series · 14 of 20 positions shown, 16 images · non-contrast
Comparison: No priors.

CLINICAL DATA: 74-year-old Caucasian female with history of
hyperlipidemia. Former smoker. Evaluate for coronary artery disease.

EXAM:
CT CARDIAC CORONARY ARTERY CALCIUM SCORE
TECHNIQUE: Non-contrast imaging through the heart was performed using
prospective ECG gating. Image post processing was performed on an
independent workstation, allowing for quantitative analysis of the
heart and coronary arteries. Note that this exam targets the heart
and the chest was not imaged in its entirety.

[Series 2: calcium scoring 2.00 qr36 bestdiast 68% hrt calciu · axial · 0.34mm/px · z∈[+1567,+1651]mm · 4 of 70 slices shown]
[im 14/70  vessel]
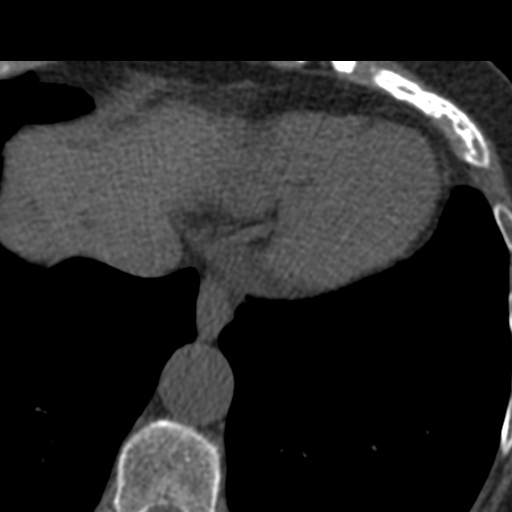
[im 28/70  vessel]
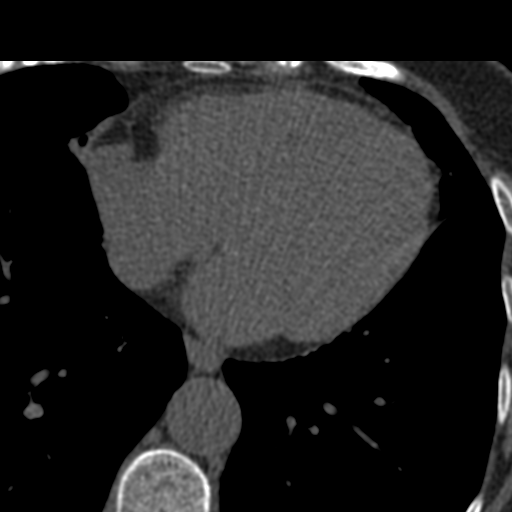
[im 42/70  vessel]
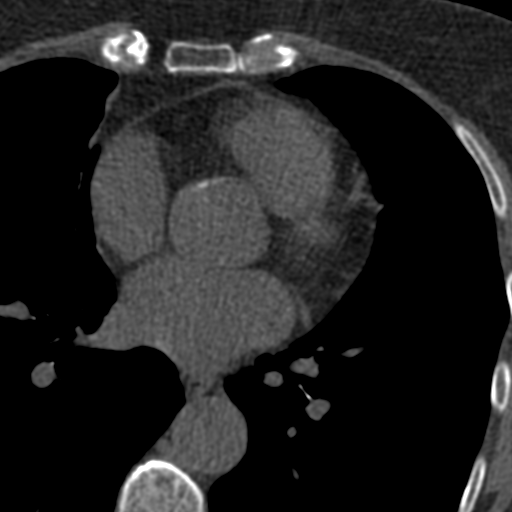
[im 56/70  vessel]
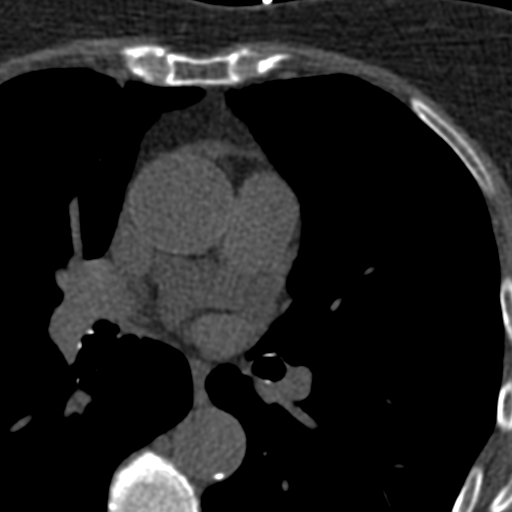

[Series 3: calcium scoring 2.00 br40 bestdiast 68% axial · axial · 0.53mm/px · z∈[+1563,+1655]mm · 5 of 70 slices shown, 7 images]
[im 12/70  vessel]
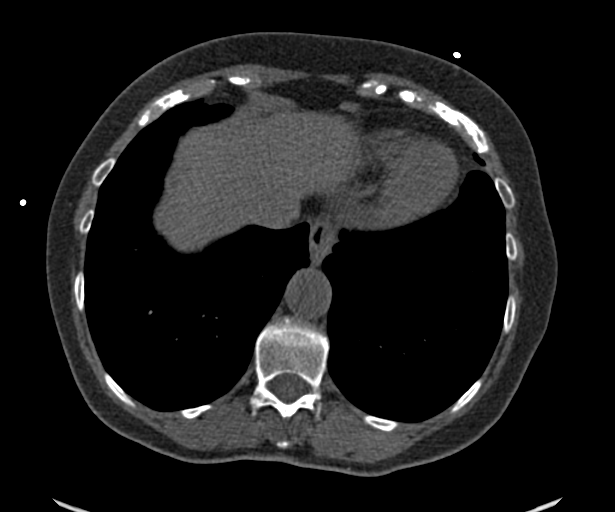
[im 12/70  lung]
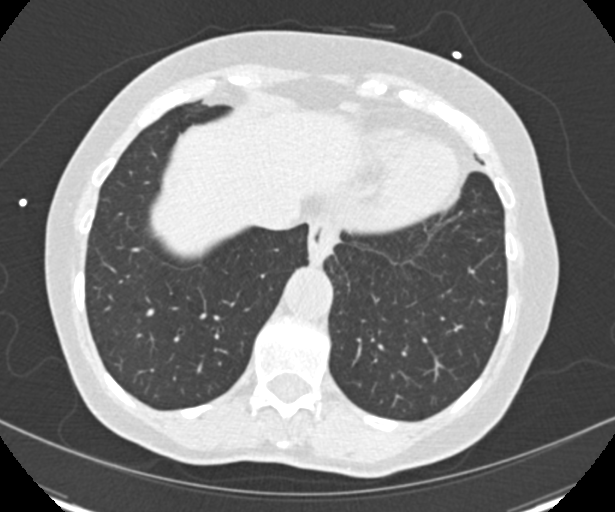
[im 24/70  vessel]
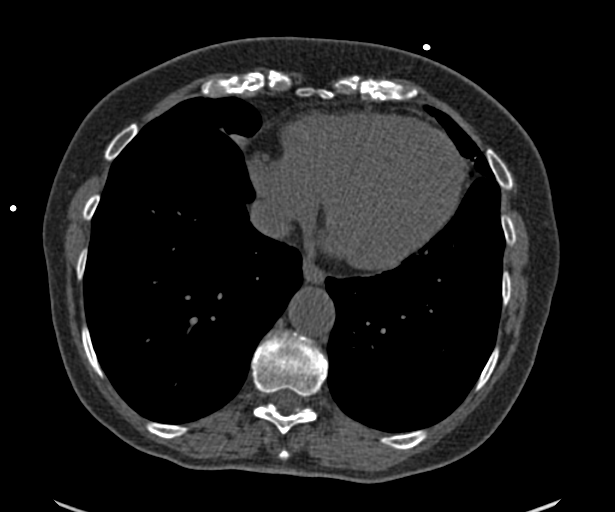
[im 35/70  vessel]
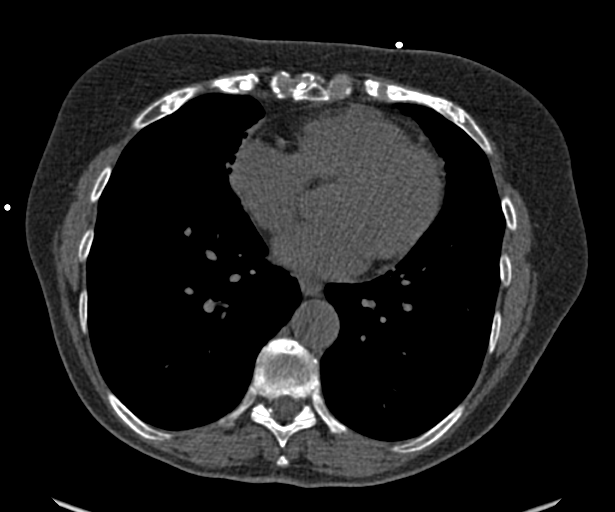
[im 47/70  vessel]
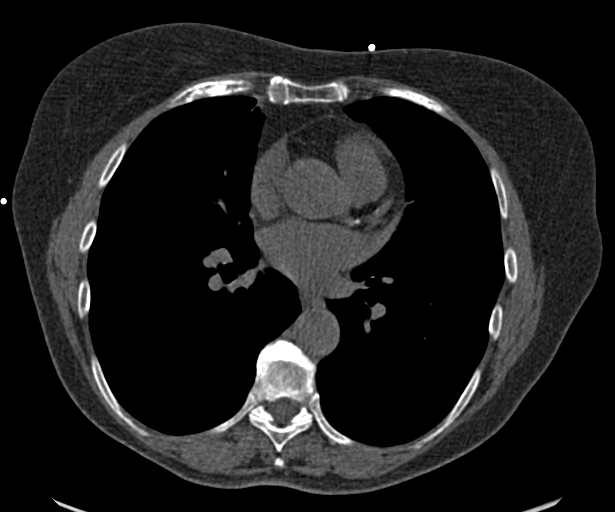
[im 58/70  vessel]
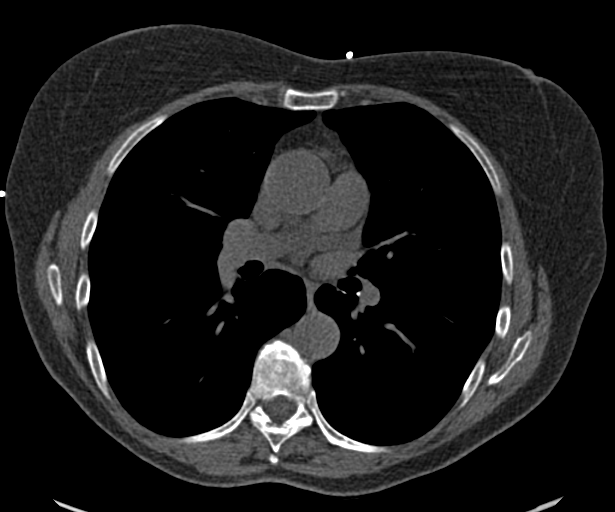
[im 58/70  lung]
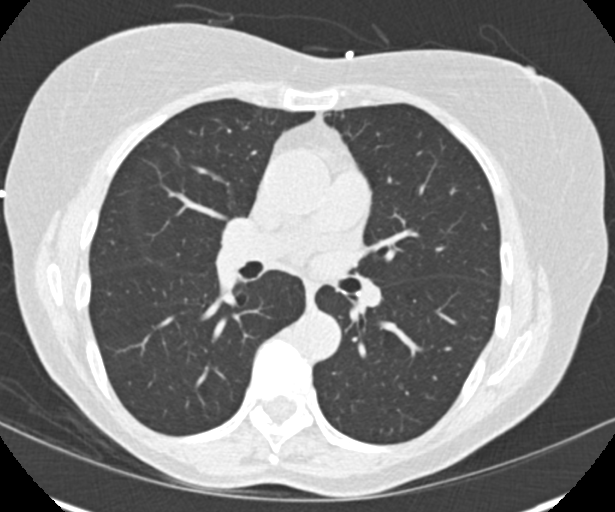

[Series 9: calcium scoring 2.00 br60 bestdiast 68% lungs · axial · 0.54mm/px · z∈[+1563,+1655]mm · 5 of 70 slices shown]
[im 12/70  vessel]
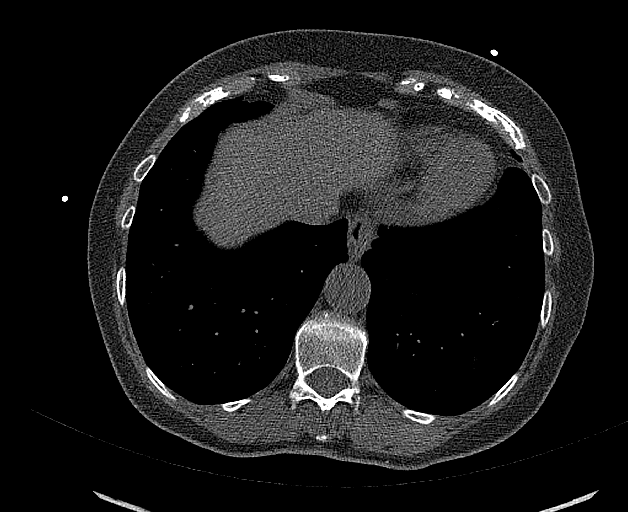
[im 24/70  vessel]
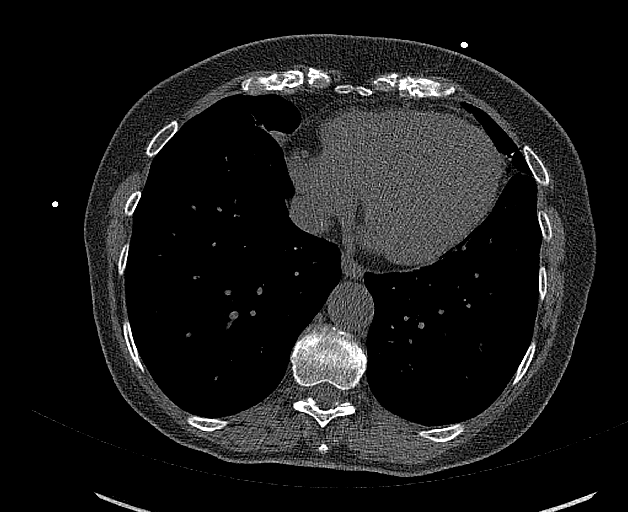
[im 35/70  vessel]
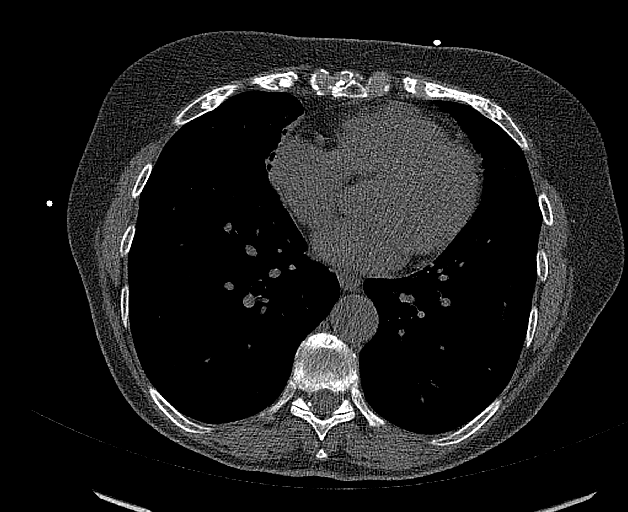
[im 47/70  vessel]
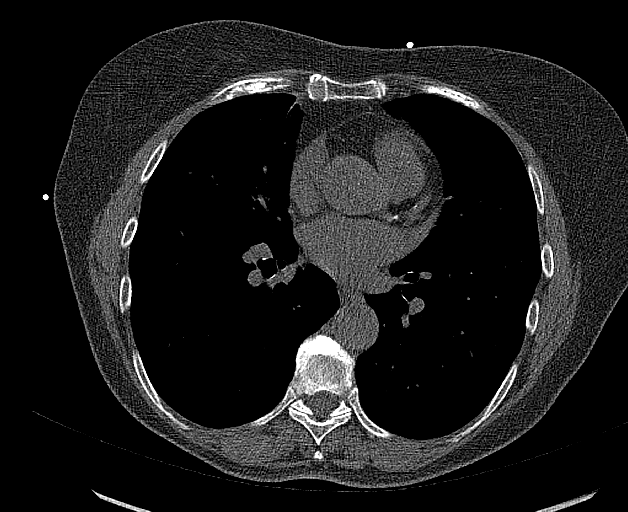
[im 58/70  vessel]
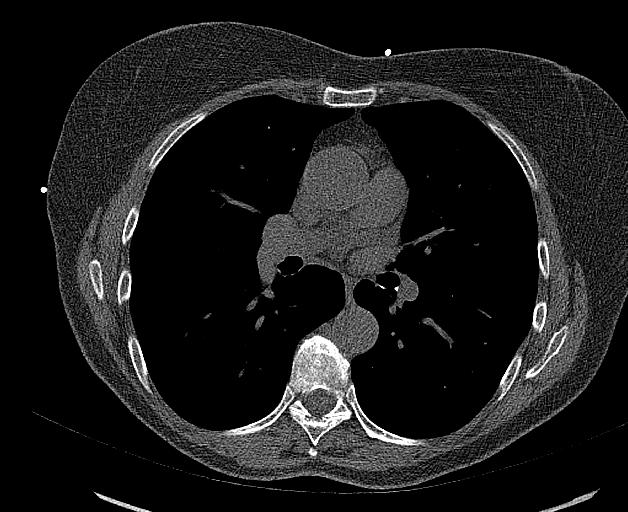

[14 of 20 positions shown; findings below may reference images not displayed]

FINDINGS: CORONARY CALCIUM SCORES:

Left Main: 8

LAD: 9

LCx: 0

RCA: 75

Total Agatston Score: 92

[HOSPITAL] percentile: 67th

AORTA MEASUREMENTS:

Ascending Aorta: 34 mm

Descending Aorta: 24 mm

OTHER FINDINGS:

Aortic atherosclerosis. Within the visualized portions of the thorax
there are no suspicious appearing pulmonary nodules or masses, there
is no acute consolidative airspace disease, no pleural effusions, no
pneumothorax and no lymphadenopathy. Visualized portions of the
upper abdomen are unremarkable. There are no aggressive appearing
lytic or blastic lesions noted in the visualized portions of the
skeleton.
IMPRESSION: 1. Patient's total coronary artery calcium score is 92 which is 67th
percentile for patient's of matched age, gender and race/ethnicity.
Please note that although the presence of coronary artery calcium
documents the presence of coronary artery disease, the severity of
this disease and any potential stenosis cannot be assessed on this
noncontrast CT examination. Assessment for potential risk factor
modification, dietary therapy or pharmacologic therapy may be
warranted, if clinically indicated.
2.  Aortic Atherosclerosis ([id number]).

## 2021-07-18 ENCOUNTER — Other Ambulatory Visit: Payer: Self-pay | Admitting: Internal Medicine

## 2021-07-18 DIAGNOSIS — Z1231 Encounter for screening mammogram for malignant neoplasm of breast: Secondary | ICD-10-CM

## 2021-08-16 ENCOUNTER — Ambulatory Visit
Admission: RE | Admit: 2021-08-16 | Discharge: 2021-08-16 | Disposition: A | Discharge status: Home / Self Care | Payer: Medicare PPO | Source: Ambulatory Visit | Attending: Internal Medicine | Admitting: Internal Medicine

## 2021-08-16 DIAGNOSIS — Z1231 Encounter for screening mammogram for malignant neoplasm of breast: Secondary | ICD-10-CM

## 2021-08-18 ENCOUNTER — Ambulatory Visit: Payer: Medicare PPO

## 2021-09-15 DIAGNOSIS — H01001 Unspecified blepharitis right upper eyelid: Secondary | ICD-10-CM | POA: Diagnosis not present

## 2021-09-15 DIAGNOSIS — Z961 Presence of intraocular lens: Secondary | ICD-10-CM | POA: Diagnosis not present

## 2021-10-05 ENCOUNTER — Encounter: Payer: Self-pay | Admitting: Gastroenterology

## 2021-10-24 ENCOUNTER — Telehealth: Payer: Self-pay | Admitting: Gastroenterology

## 2021-10-24 ENCOUNTER — Encounter: Payer: Self-pay | Admitting: Gastroenterology

## 2021-10-24 NOTE — Telephone Encounter (Signed)
error 

## 2021-11-07 ENCOUNTER — Ambulatory Visit (AMBULATORY_SURGERY_CENTER): Payer: Medicare PPO | Admitting: *Deleted

## 2021-11-07 ENCOUNTER — Other Ambulatory Visit: Payer: Self-pay

## 2021-11-07 VITALS — Ht 62.5 in | Wt 132.0 lb

## 2021-11-07 DIAGNOSIS — Z8719 Personal history of other diseases of the digestive system: Secondary | ICD-10-CM

## 2021-11-07 NOTE — Progress Notes (Signed)
No egg or soy allergy known to patient  No issues known to pt with past sedation with any surgeries or procedures Patient denies ever being told they had issues or difficulty with intubation  No FH of Malignant Hyperthermia Pt is not on diet pills Pt is not on  home 02  Pt is not on blood thinners  Pt denies issues with constipation  No A fib or A flutter  Pt is  vaccinated  for Covid   Due to the COVID-19 pandemic we are asking patients to follow certain guidelines in PV and the [facility]   Pt aware of COVID protocols and [facility] guidelines   PV completed over the phone. Pt verified name, DOB, address and insurance during PV today.  Pt mailed instruction packet with copy of consent form to read and not return, and instructions.  Pt encouraged to call with questions or issues.  If pt has My chart, procedure instructions sent via My Chart    Pt. Stated "I don't have any problems with propofol".

## 2021-11-15 ENCOUNTER — Encounter: Payer: Self-pay | Admitting: Gastroenterology

## 2021-11-24 ENCOUNTER — Ambulatory Visit (AMBULATORY_SURGERY_CENTER): Payer: Medicare PPO | Admitting: Gastroenterology

## 2021-11-24 ENCOUNTER — Other Ambulatory Visit: Payer: Self-pay

## 2021-11-24 ENCOUNTER — Other Ambulatory Visit: Payer: Self-pay | Admitting: Gastroenterology

## 2021-11-24 ENCOUNTER — Encounter: Payer: Self-pay | Admitting: Gastroenterology

## 2021-11-24 VITALS — BP 120/69 | HR 62 | Temp 97.1°F | Resp 13 | Ht 62.5 in | Wt 132.0 lb

## 2021-11-24 DIAGNOSIS — K449 Diaphragmatic hernia without obstruction or gangrene: Secondary | ICD-10-CM

## 2021-11-24 DIAGNOSIS — Z8719 Personal history of other diseases of the digestive system: Secondary | ICD-10-CM | POA: Diagnosis not present

## 2021-11-24 DIAGNOSIS — K2270 Barrett's esophagus without dysplasia: Secondary | ICD-10-CM | POA: Diagnosis not present

## 2021-11-24 DIAGNOSIS — K229 Disease of esophagus, unspecified: Secondary | ICD-10-CM | POA: Diagnosis not present

## 2021-11-24 DIAGNOSIS — K2289 Other specified disease of esophagus: Secondary | ICD-10-CM

## 2021-11-24 DIAGNOSIS — K2960 Other gastritis without bleeding: Secondary | ICD-10-CM

## 2021-11-24 DIAGNOSIS — K2950 Unspecified chronic gastritis without bleeding: Secondary | ICD-10-CM | POA: Diagnosis not present

## 2021-11-24 MED ORDER — SODIUM CHLORIDE 0.9 % IV SOLN: 500.0000 mL | Freq: Once | INTRAVENOUS | Status: DC

## 2021-11-24 NOTE — Progress Notes (Signed)
Called to room to assist during endoscopic procedure.  Patient ID and intended procedure confirmed with present staff. Received instructions for my participation in the procedure from the performing physician.  

## 2021-11-24 NOTE — Patient Instructions (Signed)
Discharge instructions given. ?Handouts on Barretts and Hiatal Hernia. ?Resume previous medications. ?YOU HAD AN ENDOSCOPIC PROCEDURE TODAY AT [facility]:   Refer to the procedure report that was given to you for any specific questions about what was found during the examination.  If the procedure report does not answer your questions, please call your gastroenterologist to clarify.  If you requested that your care partner not be given the details of your procedure findings, then the procedure report has been included in a sealed envelope for you to review at your convenience later. ? ?YOU SHOULD EXPECT: Some feelings of bloating in the abdomen. Passage of more gas than usual.  Walking can help get rid of the air that was put into your GI tract during the procedure and reduce the bloating. If you had a lower endoscopy (such as a colonoscopy or flexible sigmoidoscopy) you may notice spotting of blood in your stool or on the toilet paper. If you underwent a bowel prep for your procedure, you may not have a normal bowel movement for a few days. ? ?Please Note:  You might notice some irritation and congestion in your nose or some drainage.  This is from the oxygen used during your procedure.  There is no need for concern and it should clear up in a day or so. ? ?SYMPTOMS TO REPORT IMMEDIATELY: ? ? ?Following upper endoscopy (EGD) ? Vomiting of blood or coffee ground material ? New chest pain or pain under the shoulder blades ? Painful or persistently difficult swallowing ? New shortness of breath ? Fever of 100?F or higher ? Black, tarry-looking stools ? ?For urgent or emergent issues, a gastroenterologist can be reached at any hour by calling [phone]. ?Do not use MyChart messaging for urgent concerns.  ? ? ?DIET:  We do recommend a small meal at first, but then you may proceed to your regular diet.  Drink plenty of fluids but you should avoid alcoholic beverages for 24 hours. ? ?ACTIVITY:  You  should plan to take it easy for the rest of today and you should NOT DRIVE or use heavy machinery until tomorrow (because of the sedation medicines used during the test).   ? ?FOLLOW UP: ?Our staff will call the number listed on your records 48-72 hours following your procedure to check on you and address any questions or concerns that you may have regarding the information given to you following your procedure. If we do not reach you, we will leave a message.  We will attempt to reach you two times.  During this call, we will ask if you have developed any symptoms of COVID 19. If you develop any symptoms (ie: fever, flu-like symptoms, shortness of breath, cough etc.) before then, please call [phone].  If you test positive for Covid 19 in the 2 weeks post procedure, please call and report this information to [place].   ? ?If any biopsies were taken you will be contacted by phone or by letter within the next 1-3 weeks.  Please call us at [phone] if you have not heard about the biopsies in 3 weeks.  ? ? ?SIGNATURES/CONFIDENTIALITY: ?You and/or your care partner have signed paperwork which will be entered into your electronic medical record.  These signatures attest to the fact that that the information above on your After Visit Summary has been reviewed and is understood.  Full responsibility of the confidentiality of this discharge information lies with you and/or your care-partner.  ?

## 2021-11-24 NOTE — Progress Notes (Signed)
VS completed by CW.   Pt's states no medical or surgical changes since previsit or office visit.  

## 2021-11-24 NOTE — Op Note (Signed)
[place] ?Patient Name: Alexa Morris ?Procedure Date: 11/24/2021 2:42 PM ?MRN: [id number] ?Endoscopist: Ladene Artist , MD ?Age: 76 ?Referring MD:  ?Date of Birth: 1945/10/05 ?Gender: Female ?Account #: [id number] ?Procedure:                Upper GI endoscopy ?Indications:              Surveillance for malignancy due to personal history  ?                          of Barrett's esophagus ?Medicines:                Monitored Anesthesia Care ?Procedure:                Pre-Anesthesia Assessment: ?                          - Prior to the procedure, a History and Physical  ?                          was performed, and patient medications and  ?                          allergies were reviewed. The patient's tolerance of  ?                          previous anesthesia was also reviewed. The risks  ?                          and benefits of the procedure and the sedation  ?                          options and risks were discussed with the patient.  ?                          All questions were answered, and informed consent  ?                          was obtained. Prior Anticoagulants: The patient has  ?                          taken no previous anticoagulant or antiplatelet  ?                          agents. ASA Grade Assessment: II - A patient with  ?                          mild systemic disease. After reviewing the risks  ?                          and benefits, the patient was deemed in  ?                          satisfactory condition to undergo the procedure. ?  After obtaining informed consent, the endoscope was  ?                          passed under direct vision. Throughout the  ?                          procedure, the patient's blood pressure, pulse, and  ?                          oxygen saturations were monitored continuously. The  ?                          Endoscope was introduced through the mouth, and  ?                          advanced to the second part of  duodenum. The upper  ?                          GI endoscopy was accomplished without difficulty.  ?                          The patient tolerated the procedure well. ?Scope In: ?Scope Out: ?Findings:                 There were esophageal mucosal changes secondary to  ?                          established short-segment Barrett's disease present  ?                          in the distal esophagus. The maximum longitudinal  ?                          extent of these mucosal changes was 1 cm in length.  ?                          Mucosa was biopsied with a cold forceps for  ?                          histology in a targeted manner at intervals of 0.5  ?                          cm at the gastroesophageal junction. One specimen  ?                          bottle was sent to pathology. ?                          The exam of the esophagus was otherwise normal. ?                          A small hiatal hernia was present. ?  The exam of the stomach was otherwise normal. ?                          The duodenal bulb and second portion of the  ?                          duodenum were normal. ?Complications:            No immediate complications. ?Estimated Blood Loss:     Estimated blood loss was minimal. ?Impression:               - Esophageal mucosal changes secondary to  ?                          established short-segment Barrett's disease.  ?                          Biopsied. ?                          - Small hiatal hernia. ?                          - Normal duodenal bulb and second portion of the  ?                          duodenum. ?Recommendation:           - Patient has a contact number available for  ?                          emergencies. The signs and symptoms of potential  ?                          delayed complications were discussed with the  ?                          patient. Return to normal activities tomorrow.  ?                          Written discharge instructions were  provided to the  ?                          patient. ?                          - Resume previous diet. ?                          - Follow antiereflux measures. ?                          - Continue present medications. ?                          - Await pathology results. ?                          -  Consider repeat upper endoscopy in 3-5 years vs  ?                          no repeat due to age after studies are complete for  ?                          surveillance of Barrett's esophagus. ?Ladene Artist, MD ?11/24/2021 3:10:57 PM ?This report has been signed electronically. ?

## 2021-11-24 NOTE — Progress Notes (Signed)
? ?History & Physical ? ?Primary Care Physician:  Crist Infante, MD ?Primary Gastroenterologist: Lucio Edward, MD ? ?CHIEF COMPLAINT:  Barrett's  ? ?HPI: Alexa Morris is a 76 y.o. female with Barrett's esophagus here for EGD.  ? ? ?Past Medical History:  ?Diagnosis Date  ? Allergy   ? Anemia   ? as a teen  ? Broken legs   ? "AS A CHILD"  ? Cataract   ? BILATERAL-REMOVED  ? Clotting disorder ([place])   ? slow to clot  ? GERD (gastroesophageal reflux disease)   ? History of Barrett's esophagus   ? Hyperlipidemia   ? Hypotension   ? Osteopenia   ? Osteoporosis   ? base of spine per pt  ? Seasonal allergies   ? ? ?Past Surgical History:  ?Procedure Laterality Date  ? CATARACT Bilateral   ? REMOVED  ? COLONOSCOPY  10/16-MS  ? laparascopy    ? to prevent pregnancy  ? LAPROSCOPIC TUBAL LIGATION    ? TOE SURGERY    ? little toe on left foot  ? UPPER GASTROINTESTINAL ENDOSCOPY    ? ? ?Prior to Admission medications   ?Medication Sig Start Date End Date Taking? Authorizing Provider  ?Cholecalciferol (D3-1000) 25 MCG (1000 UT) tablet Take 1,000 Units by mouth daily.   Yes [provider]  ?esomeprazole (NEXIUM) 20 MG capsule Take 20 mg by mouth daily at 12 noon.   Yes [provider]  ?ezetimibe (ZETIA) 10 MG tablet Take 10 mg by mouth daily.   Yes [provider]  ?Fluticasone Propionate (FLONASE ALLERGY RELIEF NA) Place into the nose as needed.   Yes [provider]  ?Multiple Vitamin (MULTIVITAMIN WITH MINERALS) TABS tablet Take 1 tablet by mouth daily.   Yes [provider]  ?Probiotic Product (PROBIOTIC ADVANCED PO) Take 365 mg by mouth.   Yes [provider]  ?rosuvastatin (CRESTOR) 20 MG tablet Take 20 mg by mouth daily.   Yes [provider]  ?rosuvastatin (CRESTOR) 20 MG tablet Take 1 tablet by mouth daily.   Yes [provider]  ?Simethicone (GAS RELIEF DROPS PO) Take by mouth as needed.   Yes [provider]  ?acetaminophen (TYLENOL)  325 MG tablet Take 650 mg by mouth every 6 (six) hours as needed.    [provider]  ?co-enzyme Q-10 50 MG capsule Take 100 mg by mouth daily. ?Patient not taking: Reported on 11/07/2021    [provider]  ?COLLAGEN PO Take by mouth. ?Patient not taking: Reported on 11/07/2021    [provider]  ? ? ?Current Outpatient Medications  ?Medication Sig Dispense Refill  ? Cholecalciferol (D3-1000) 25 MCG (1000 UT) tablet Take 1,000 Units by mouth daily.    ? esomeprazole (NEXIUM) 20 MG capsule Take 20 mg by mouth daily at 12 noon.    ? ezetimibe (ZETIA) 10 MG tablet Take 10 mg by mouth daily.    ? Fluticasone Propionate (FLONASE ALLERGY RELIEF NA) Place into the nose as needed.    ? Multiple Vitamin (MULTIVITAMIN WITH MINERALS) TABS tablet Take 1 tablet by mouth daily.    ? Probiotic Product (PROBIOTIC ADVANCED PO) Take 365 mg by mouth.    ? rosuvastatin (CRESTOR) 20 MG tablet Take 20 mg by mouth daily.    ? rosuvastatin (CRESTOR) 20 MG tablet Take 1 tablet by mouth daily.    ? Simethicone (GAS RELIEF DROPS PO) Take by mouth as needed.    ? acetaminophen (TYLENOL) 325  MG tablet Take 650 mg by mouth every 6 (six) hours as needed.    ? co-enzyme Q-10 50 MG capsule Take 100 mg by mouth daily. (Patient not taking: Reported on 11/07/2021)    ? COLLAGEN PO Take by mouth. (Patient not taking: Reported on 11/07/2021)    ? ?Current Facility-Administered Medications  ?Medication Dose Route Frequency Provider Last Rate Last Admin  ? 0.9 %  sodium chloride infusion  500 mL Intravenous Once Ladene Artist, MD      ? ? ?Allergies as of 11/24/2021 - Review Complete 11/24/2021  ?Allergen Reaction Noted  ? Poison ivy treatments Other (See Comments) 04/25/2021  ? Sulfamethoxazole-trimethoprim Other (See Comments) 04/25/2021  ? Aspirin  03/01/2010  ? ? ?Family History  ?Problem Relation Age of Onset  ? Kidney disease Father   ? Colon cancer Father   ?     unknown  ? Esophageal cancer Father   ? GER disease  Daughter   ? Diabetes Son   ? Stomach cancer Neg Hx   ? Rectal cancer Neg Hx   ? ? ?Social History  ? ?Socioeconomic History  ? Marital status: Married  ?  Spouse name: Not on file  ? Number of children: Not on file  ? Years of education: Not on file  ? Highest education level: Not on file  ?Occupational History  ? Occupation: retired   ?  Comment: teacher  ?Tobacco Use  ? Smoking status: Former  ?  Types: Cigarettes  ?  Quit date: 41  ?  Years since quitting: 49.2  ?  Passive exposure: Never  ? Smokeless tobacco: Never  ?Vaping Use  ? Vaping Use: Never used  ?Substance and Sexual Activity  ? Alcohol use: Yes  ?  Alcohol/week: 4.0 standard drinks  ?  Types: 4 Standard drinks or equivalent per week  ?  Comment: WINE 4 X AWEEK  ? Drug use: No  ? Sexual activity: Never  ?Other Topics Concern  ? Not on file  ?Social History Narrative  ? Not on file  ? ?Social Determinants of Health  ? ?Financial Resource Strain: Not on file  ?Food Insecurity: Not on file  ?Transportation Needs: Not on file  ?Physical Activity: Not on file  ?Stress: Not on file  ?Social Connections: Not on file  ?Intimate Partner Violence: Not on file  ? ? ?Review of Systems: ? ?All systems reviewed an negative except where noted in HPI. ? ?Gen: Denies any fever, chills, sweats, anorexia, fatigue, weakness, malaise, weight loss, and sleep disorder ?CV: Denies chest pain, angina, palpitations, syncope, orthopnea, PND, peripheral edema, and claudication. ?Resp: Denies dyspnea at rest, dyspnea with exercise, cough, sputum, wheezing, coughing up blood, and pleurisy. ?GI: Denies vomiting blood, jaundice, and fecal incontinence.   Denies dysphagia or odynophagia. ?GU : Denies urinary burning, blood in urine, urinary frequency, urinary hesitancy, nocturnal urination, and urinary incontinence. ?MS: Denies joint pain, limitation of movement, and swelling, stiffness, low back pain, extremity pain. Denies muscle weakness, cramps, atrophy.  ?Derm: Denies rash,  itching, dry skin, hives, moles, warts, or unhealing ulcers.  ?Psych: Denies depression, anxiety, memory loss, suicidal ideation, hallucinations, paranoia, and confusion. ?Heme: Denies bruising, bleeding, and enlarged lymph nodes. ?Neuro:  Denies any headaches, dizziness, paresthesias. ?Endo:  Denies any problems with DM, thyroid, adrenal function. ? ? ?Physical Exam: ?General:  Alert, well-developed, in NAD ?Head:  Normocephalic and atraumatic. ?Eyes:  Sclera clear, no icterus.   Conjunctiva pink. ?Ears:  Normal auditory acuity. ?Mouth:  No deformity or lesions.  ?Neck:  Supple; no masses . ?Lungs:  Clear throughout to auscultation.   No wheezes, crackles, or rhonchi. No acute distress. ?Heart:  Regular rate and rhythm; no murmurs. ?Abdomen:  Soft, nondistended, nontender. No masses, hepatomegaly. No obvious masses.  Normal bowel .    ?Rectal:  Deferred   ?Msk:  Symmetrical without gross deformities.Marland Kitchen ?Pulses:  Normal pulses noted. ?Extremities:  Without edema. ?Neurologic:  Alert and  oriented x4;  grossly normal neurologically. ?Skin:  Intact without significant lesions or rashes. ?Cervical Nodes:  No significant cervical adenopathy. ?Psych:  Alert and cooperative. Normal mood and affect. ? ? ?Impression / Plan:  ? ? ? ?Kerrilyn Azbill T. Fuller Plan  11/24/2021, 2:48 PM ?See Shea Evans, [facility], to contact our on call provider ? ? ?  ?

## 2021-11-24 NOTE — Progress Notes (Signed)
Report to PACU, RN, vss, BBS= Clear.  

## 2021-11-28 ENCOUNTER — Encounter: Payer: Medicare PPO | Admitting: Gastroenterology

## 2021-11-28 ENCOUNTER — Telehealth: Payer: Self-pay | Admitting: *Deleted

## 2021-11-28 NOTE — Telephone Encounter (Signed)
?  Follow up Call- ? ?Call back number 11/24/2021 12/08/2020  ?Post procedure Call Back phone  # [phone]  ?Permission to leave phone message Yes Yes  ?Some recent data might be hidden  ?  ? ?Patient questions: ? ?Do you have a fever, pain , or abdominal swelling? No. ?Pain Score  0 * ? ?Have you tolerated food without any problems? Yes.   ? ?Have you been able to return to your normal activities? Yes.   ? ?Do you have any questions about your discharge instructions: ?Diet   No. ?Medications  No. ?Follow up visit  No. ? ?Do you have questions or concerns about your Care? No. ? ?Actions: ?* If pain score is 4 or above: ?No action needed, pain <4. ? ? ?

## 2021-12-08 ENCOUNTER — Encounter: Payer: Self-pay | Admitting: Gastroenterology

## 2021-12-12 ENCOUNTER — Encounter: Payer: Self-pay | Admitting: Gastroenterology

## 2022-01-11 DIAGNOSIS — E785 Hyperlipidemia, unspecified: Secondary | ICD-10-CM | POA: Diagnosis not present

## 2022-01-11 DIAGNOSIS — M859 Disorder of bone density and structure, unspecified: Secondary | ICD-10-CM | POA: Diagnosis not present

## 2022-01-11 DIAGNOSIS — M810 Age-related osteoporosis without current pathological fracture: Secondary | ICD-10-CM | POA: Diagnosis not present

## 2022-01-11 DIAGNOSIS — I2510 Atherosclerotic heart disease of native coronary artery without angina pectoris: Secondary | ICD-10-CM | POA: Diagnosis not present

## 2022-01-18 DIAGNOSIS — R87619 Unspecified abnormal cytological findings in specimens from cervix uteri: Secondary | ICD-10-CM | POA: Diagnosis not present

## 2022-01-18 DIAGNOSIS — E785 Hyperlipidemia, unspecified: Secondary | ICD-10-CM | POA: Diagnosis not present

## 2022-01-18 DIAGNOSIS — R82998 Other abnormal findings in urine: Secondary | ICD-10-CM | POA: Diagnosis not present

## 2022-01-18 DIAGNOSIS — Z1389 Encounter for screening for other disorder: Secondary | ICD-10-CM | POA: Diagnosis not present

## 2022-01-18 DIAGNOSIS — Z23 Encounter for immunization: Secondary | ICD-10-CM | POA: Diagnosis not present

## 2022-01-18 DIAGNOSIS — M25559 Pain in unspecified hip: Secondary | ICD-10-CM | POA: Diagnosis not present

## 2022-01-18 DIAGNOSIS — Z1331 Encounter for screening for depression: Secondary | ICD-10-CM | POA: Diagnosis not present

## 2022-01-18 DIAGNOSIS — Z0000 Encounter for general adult medical examination without abnormal findings: Secondary | ICD-10-CM | POA: Diagnosis not present

## 2022-01-18 DIAGNOSIS — I2510 Atherosclerotic heart disease of native coronary artery without angina pectoris: Secondary | ICD-10-CM | POA: Diagnosis not present

## 2022-01-18 DIAGNOSIS — I700 Atherosclerosis of aorta: Secondary | ICD-10-CM | POA: Diagnosis not present

## 2022-01-18 DIAGNOSIS — K2270 Barrett's esophagus without dysplasia: Secondary | ICD-10-CM | POA: Diagnosis not present

## 2022-01-18 DIAGNOSIS — M810 Age-related osteoporosis without current pathological fracture: Secondary | ICD-10-CM | POA: Diagnosis not present

## 2022-01-19 DIAGNOSIS — R21 Rash and other nonspecific skin eruption: Secondary | ICD-10-CM | POA: Diagnosis not present

## 2022-02-21 DIAGNOSIS — R030 Elevated blood-pressure reading, without diagnosis of hypertension: Secondary | ICD-10-CM | POA: Diagnosis not present

## 2022-02-24 DIAGNOSIS — R030 Elevated blood-pressure reading, without diagnosis of hypertension: Secondary | ICD-10-CM | POA: Diagnosis not present

## 2022-03-02 DIAGNOSIS — Z01419 Encounter for gynecological examination (general) (routine) without abnormal findings: Secondary | ICD-10-CM | POA: Diagnosis not present

## 2022-03-02 DIAGNOSIS — Z0142 Encounter for cervical smear to confirm findings of recent normal smear following initial abnormal smear: Secondary | ICD-10-CM | POA: Diagnosis not present

## 2022-03-02 DIAGNOSIS — Z6823 Body mass index (BMI) 23.0-23.9, adult: Secondary | ICD-10-CM | POA: Diagnosis not present

## 2022-03-02 DIAGNOSIS — Z124 Encounter for screening for malignant neoplasm of cervix: Secondary | ICD-10-CM | POA: Diagnosis not present

## 2022-03-02 DIAGNOSIS — Z01411 Encounter for gynecological examination (general) (routine) with abnormal findings: Secondary | ICD-10-CM | POA: Diagnosis not present

## 2022-09-13 DIAGNOSIS — H2000 Unspecified acute and subacute iridocyclitis: Secondary | ICD-10-CM | POA: Diagnosis not present

## 2022-09-13 DIAGNOSIS — S0501XA Injury of conjunctiva and corneal abrasion without foreign body, right eye, initial encounter: Secondary | ICD-10-CM | POA: Diagnosis not present

## 2022-09-19 ENCOUNTER — Other Ambulatory Visit: Payer: Self-pay | Admitting: Internal Medicine

## 2022-09-19 DIAGNOSIS — Z1231 Encounter for screening mammogram for malignant neoplasm of breast: Secondary | ICD-10-CM

## 2022-09-21 DIAGNOSIS — H04123 Dry eye syndrome of bilateral lacrimal glands: Secondary | ICD-10-CM | POA: Diagnosis not present

## 2022-09-21 DIAGNOSIS — H26493 Other secondary cataract, bilateral: Secondary | ICD-10-CM | POA: Diagnosis not present

## 2022-09-21 DIAGNOSIS — Z961 Presence of intraocular lens: Secondary | ICD-10-CM | POA: Diagnosis not present

## 2022-09-22 ENCOUNTER — Ambulatory Visit
Admission: RE | Admit: 2022-09-22 | Discharge: 2022-09-22 | Disposition: A | Discharge status: Home / Self Care | Payer: Medicare PPO | Source: Ambulatory Visit | Attending: Internal Medicine | Admitting: Internal Medicine

## 2022-09-22 DIAGNOSIS — Z1231 Encounter for screening mammogram for malignant neoplasm of breast: Secondary | ICD-10-CM

## 2022-09-28 DIAGNOSIS — R238 Other skin changes: Secondary | ICD-10-CM | POA: Diagnosis not present

## 2022-09-28 DIAGNOSIS — L578 Other skin changes due to chronic exposure to nonionizing radiation: Secondary | ICD-10-CM | POA: Diagnosis not present

## 2022-09-28 DIAGNOSIS — L719 Rosacea, unspecified: Secondary | ICD-10-CM | POA: Diagnosis not present

## 2022-09-28 DIAGNOSIS — L820 Inflamed seborrheic keratosis: Secondary | ICD-10-CM | POA: Diagnosis not present

## 2022-09-28 DIAGNOSIS — L821 Other seborrheic keratosis: Secondary | ICD-10-CM | POA: Diagnosis not present

## 2022-09-28 DIAGNOSIS — L219 Seborrheic dermatitis, unspecified: Secondary | ICD-10-CM | POA: Diagnosis not present

## 2022-09-28 DIAGNOSIS — L814 Other melanin hyperpigmentation: Secondary | ICD-10-CM | POA: Diagnosis not present

## 2023-02-12 DIAGNOSIS — M810 Age-related osteoporosis without current pathological fracture: Secondary | ICD-10-CM | POA: Diagnosis not present

## 2023-02-12 DIAGNOSIS — E785 Hyperlipidemia, unspecified: Secondary | ICD-10-CM | POA: Diagnosis not present

## 2023-02-12 DIAGNOSIS — I700 Atherosclerosis of aorta: Secondary | ICD-10-CM | POA: Diagnosis not present

## 2023-02-12 DIAGNOSIS — R7989 Other specified abnormal findings of blood chemistry: Secondary | ICD-10-CM | POA: Diagnosis not present

## 2023-02-12 DIAGNOSIS — K219 Gastro-esophageal reflux disease without esophagitis: Secondary | ICD-10-CM | POA: Diagnosis not present

## 2023-02-13 DIAGNOSIS — M810 Age-related osteoporosis without current pathological fracture: Secondary | ICD-10-CM | POA: Diagnosis not present

## 2023-02-23 DIAGNOSIS — R896 Abnormal cytological findings in specimens from other organs, systems and tissues: Secondary | ICD-10-CM | POA: Diagnosis not present

## 2023-02-23 DIAGNOSIS — R011 Cardiac murmur, unspecified: Secondary | ICD-10-CM | POA: Diagnosis not present

## 2023-02-23 DIAGNOSIS — Z1389 Encounter for screening for other disorder: Secondary | ICD-10-CM | POA: Diagnosis not present

## 2023-02-23 DIAGNOSIS — I2510 Atherosclerotic heart disease of native coronary artery without angina pectoris: Secondary | ICD-10-CM | POA: Diagnosis not present

## 2023-02-23 DIAGNOSIS — H9192 Unspecified hearing loss, left ear: Secondary | ICD-10-CM | POA: Diagnosis not present

## 2023-02-23 DIAGNOSIS — M810 Age-related osteoporosis without current pathological fracture: Secondary | ICD-10-CM | POA: Diagnosis not present

## 2023-02-23 DIAGNOSIS — E785 Hyperlipidemia, unspecified: Secondary | ICD-10-CM | POA: Diagnosis not present

## 2023-02-23 DIAGNOSIS — Z1212 Encounter for screening for malignant neoplasm of rectum: Secondary | ICD-10-CM | POA: Diagnosis not present

## 2023-02-23 DIAGNOSIS — K2270 Barrett's esophagus without dysplasia: Secondary | ICD-10-CM | POA: Diagnosis not present

## 2023-02-23 DIAGNOSIS — R87619 Unspecified abnormal cytological findings in specimens from cervix uteri: Secondary | ICD-10-CM | POA: Diagnosis not present

## 2023-02-23 DIAGNOSIS — R82998 Other abnormal findings in urine: Secondary | ICD-10-CM | POA: Diagnosis not present

## 2023-02-23 DIAGNOSIS — I700 Atherosclerosis of aorta: Secondary | ICD-10-CM | POA: Diagnosis not present

## 2023-02-23 DIAGNOSIS — J309 Allergic rhinitis, unspecified: Secondary | ICD-10-CM | POA: Diagnosis not present

## 2023-02-23 DIAGNOSIS — A6000 Herpesviral infection of urogenital system, unspecified: Secondary | ICD-10-CM | POA: Diagnosis not present

## 2023-02-23 DIAGNOSIS — Z0000 Encounter for general adult medical examination without abnormal findings: Secondary | ICD-10-CM | POA: Diagnosis not present

## 2023-03-07 DIAGNOSIS — Z01411 Encounter for gynecological examination (general) (routine) with abnormal findings: Secondary | ICD-10-CM | POA: Diagnosis not present

## 2023-03-07 DIAGNOSIS — Z6822 Body mass index (BMI) 22.0-22.9, adult: Secondary | ICD-10-CM | POA: Diagnosis not present

## 2023-03-07 DIAGNOSIS — Z124 Encounter for screening for malignant neoplasm of cervix: Secondary | ICD-10-CM | POA: Diagnosis not present

## 2023-03-07 DIAGNOSIS — M810 Age-related osteoporosis without current pathological fracture: Secondary | ICD-10-CM | POA: Diagnosis not present

## 2023-03-07 DIAGNOSIS — Z01419 Encounter for gynecological examination (general) (routine) without abnormal findings: Secondary | ICD-10-CM | POA: Diagnosis not present

## 2023-10-04 DIAGNOSIS — D485 Neoplasm of uncertain behavior of skin: Secondary | ICD-10-CM | POA: Diagnosis not present

## 2023-10-04 DIAGNOSIS — L578 Other skin changes due to chronic exposure to nonionizing radiation: Secondary | ICD-10-CM | POA: Diagnosis not present

## 2023-10-04 DIAGNOSIS — D229 Melanocytic nevi, unspecified: Secondary | ICD-10-CM | POA: Diagnosis not present

## 2023-10-04 DIAGNOSIS — L570 Actinic keratosis: Secondary | ICD-10-CM | POA: Diagnosis not present

## 2023-10-04 DIAGNOSIS — D2361 Other benign neoplasm of skin of right upper limb, including shoulder: Secondary | ICD-10-CM | POA: Diagnosis not present

## 2023-10-04 DIAGNOSIS — L821 Other seborrheic keratosis: Secondary | ICD-10-CM | POA: Diagnosis not present

## 2023-10-04 DIAGNOSIS — L820 Inflamed seborrheic keratosis: Secondary | ICD-10-CM | POA: Diagnosis not present

## 2023-10-04 DIAGNOSIS — L814 Other melanin hyperpigmentation: Secondary | ICD-10-CM | POA: Diagnosis not present

## 2023-10-24 DIAGNOSIS — Z961 Presence of intraocular lens: Secondary | ICD-10-CM | POA: Diagnosis not present

## 2023-10-24 DIAGNOSIS — H01004 Unspecified blepharitis left upper eyelid: Secondary | ICD-10-CM | POA: Diagnosis not present

## 2023-10-24 DIAGNOSIS — H01001 Unspecified blepharitis right upper eyelid: Secondary | ICD-10-CM | POA: Diagnosis not present

## 2023-10-24 DIAGNOSIS — H26493 Other secondary cataract, bilateral: Secondary | ICD-10-CM | POA: Diagnosis not present

## 2023-11-05 ENCOUNTER — Other Ambulatory Visit: Payer: Self-pay | Admitting: Internal Medicine

## 2023-11-05 DIAGNOSIS — Z1231 Encounter for screening mammogram for malignant neoplasm of breast: Secondary | ICD-10-CM

## 2023-11-14 ENCOUNTER — Ambulatory Visit
Admission: RE | Admit: 2023-11-14 | Discharge: 2023-11-14 | Disposition: A | Discharge status: Home / Self Care | Payer: Medicare PPO | Source: Ambulatory Visit | Attending: Internal Medicine | Admitting: Internal Medicine

## 2023-11-14 DIAGNOSIS — Z1231 Encounter for screening mammogram for malignant neoplasm of breast: Secondary | ICD-10-CM

## 2023-12-03 DIAGNOSIS — K1379 Other lesions of oral mucosa: Secondary | ICD-10-CM | POA: Diagnosis not present

## 2023-12-03 DIAGNOSIS — D485 Neoplasm of uncertain behavior of skin: Secondary | ICD-10-CM | POA: Diagnosis not present

## 2023-12-03 DIAGNOSIS — L309 Dermatitis, unspecified: Secondary | ICD-10-CM | POA: Diagnosis not present

## 2023-12-03 DIAGNOSIS — L570 Actinic keratosis: Secondary | ICD-10-CM | POA: Diagnosis not present

## 2023-12-03 DIAGNOSIS — L986 Other infiltrative disorders of the skin and subcutaneous tissue: Secondary | ICD-10-CM | POA: Diagnosis not present

## 2023-12-19 DIAGNOSIS — L820 Inflamed seborrheic keratosis: Secondary | ICD-10-CM | POA: Diagnosis not present

## 2024-01-16 DIAGNOSIS — L820 Inflamed seborrheic keratosis: Secondary | ICD-10-CM | POA: Diagnosis not present

## 2024-01-16 DIAGNOSIS — L570 Actinic keratosis: Secondary | ICD-10-CM | POA: Diagnosis not present

## 2024-02-22 ENCOUNTER — Ambulatory Visit (INDEPENDENT_AMBULATORY_CARE_PROVIDER_SITE_OTHER)

## 2024-02-22 ENCOUNTER — Encounter: Payer: Self-pay | Admitting: Podiatry

## 2024-02-22 ENCOUNTER — Ambulatory Visit: Admitting: Podiatry

## 2024-02-22 VITALS — Ht 62.5 in | Wt 127.0 lb

## 2024-02-22 DIAGNOSIS — M76821 Posterior tibial tendinitis, right leg: Secondary | ICD-10-CM

## 2024-02-22 DIAGNOSIS — G629 Polyneuropathy, unspecified: Secondary | ICD-10-CM

## 2024-02-22 DIAGNOSIS — M778 Other enthesopathies, not elsewhere classified: Secondary | ICD-10-CM

## 2024-02-22 MED ORDER — TRIAMCINOLONE ACETONIDE 10 MG/ML IJ SUSP
10.0000 mg | Freq: Once | INTRAMUSCULAR | Status: AC
  Administered 2024-02-22: 10 mg via INTRA_ARTICULAR

## 2024-02-22 NOTE — Progress Notes (Signed)
 Subjective:   Patient ID: Alexa Morris, female   DOB: 78 y.o.   MRN: [id number]   HPI Patient presents with pain in the right foot states it is in her arch and it was severely sore it is some better now but now it is just kind of bothersome and a little bit harder for me to walk.  Patient is active and does not smoke   Review of Systems  All other systems reviewed and are negative.       Objective:  Physical Exam Vitals and nursing note reviewed.  Constitutional:      Appearance: She is well-developed.  Pulmonary:     Effort: Pulmonary effort is normal.   Musculoskeletal:        General: Normal range of motion.   Skin:    General: Skin is warm.   Neurological:     Mental Status: She is alert.     Neurovascular status intact muscle strength adequate range of motion adequate with patient noted to have discomfort with inflammation fluid at that navicular bone right at the insertion of posterior tibial tendon with mild depression of the arch and no indication of tendon disc function.  Good digital perfusion     Assessment:  Probability for inflammatory condition with posterior tibial tendinitis right     Plan:  H&P x-ray reviewed today I went ahead did sterile prep I injected the tendon at insertion 3 mg Dexasone Kenalog 5 mg Xylocaine after explaining risk and I advised her to be easy on it for a number of days.  I discussed good foot support which she has and if it were to become symptomatic again I need to see  X-rays indicate no signs of navicular pathology mild depression of arch

## 2024-06-05 DIAGNOSIS — E7849 Other hyperlipidemia: Secondary | ICD-10-CM | POA: Diagnosis not present

## 2024-06-05 DIAGNOSIS — Z1212 Encounter for screening for malignant neoplasm of rectum: Secondary | ICD-10-CM | POA: Diagnosis not present

## 2024-06-05 DIAGNOSIS — Z1389 Encounter for screening for other disorder: Secondary | ICD-10-CM | POA: Diagnosis not present

## 2024-06-05 DIAGNOSIS — I2510 Atherosclerotic heart disease of native coronary artery without angina pectoris: Secondary | ICD-10-CM | POA: Diagnosis not present

## 2024-06-05 DIAGNOSIS — M810 Age-related osteoporosis without current pathological fracture: Secondary | ICD-10-CM | POA: Diagnosis not present

## 2024-06-05 DIAGNOSIS — E785 Hyperlipidemia, unspecified: Secondary | ICD-10-CM | POA: Diagnosis not present

## 2024-06-12 DIAGNOSIS — H9192 Unspecified hearing loss, left ear: Secondary | ICD-10-CM | POA: Diagnosis not present

## 2024-06-12 DIAGNOSIS — I700 Atherosclerosis of aorta: Secondary | ICD-10-CM | POA: Diagnosis not present

## 2024-06-12 DIAGNOSIS — J309 Allergic rhinitis, unspecified: Secondary | ICD-10-CM | POA: Diagnosis not present

## 2024-06-12 DIAGNOSIS — E785 Hyperlipidemia, unspecified: Secondary | ICD-10-CM | POA: Diagnosis not present

## 2024-06-12 DIAGNOSIS — M810 Age-related osteoporosis without current pathological fracture: Secondary | ICD-10-CM | POA: Diagnosis not present

## 2024-06-12 DIAGNOSIS — R82998 Other abnormal findings in urine: Secondary | ICD-10-CM | POA: Diagnosis not present

## 2024-06-12 DIAGNOSIS — E7849 Other hyperlipidemia: Secondary | ICD-10-CM | POA: Diagnosis not present

## 2024-06-12 DIAGNOSIS — Z23 Encounter for immunization: Secondary | ICD-10-CM | POA: Diagnosis not present

## 2024-06-12 DIAGNOSIS — K2270 Barrett's esophagus without dysplasia: Secondary | ICD-10-CM | POA: Diagnosis not present

## 2024-06-12 DIAGNOSIS — Z1331 Encounter for screening for depression: Secondary | ICD-10-CM | POA: Diagnosis not present

## 2024-06-12 DIAGNOSIS — Z0000 Encounter for general adult medical examination without abnormal findings: Secondary | ICD-10-CM | POA: Diagnosis not present

## 2024-06-12 DIAGNOSIS — K219 Gastro-esophageal reflux disease without esophagitis: Secondary | ICD-10-CM | POA: Diagnosis not present

## 2024-06-12 DIAGNOSIS — I2510 Atherosclerotic heart disease of native coronary artery without angina pectoris: Secondary | ICD-10-CM | POA: Diagnosis not present

## 2024-06-12 DIAGNOSIS — Z1212 Encounter for screening for malignant neoplasm of rectum: Secondary | ICD-10-CM | POA: Diagnosis not present

## 2024-06-12 DIAGNOSIS — Z1389 Encounter for screening for other disorder: Secondary | ICD-10-CM | POA: Diagnosis not present

## 2024-06-19 DIAGNOSIS — Z124 Encounter for screening for malignant neoplasm of cervix: Secondary | ICD-10-CM | POA: Diagnosis not present

## 2024-06-19 DIAGNOSIS — Z1331 Encounter for screening for depression: Secondary | ICD-10-CM | POA: Diagnosis not present

## 2024-07-23 ENCOUNTER — Other Ambulatory Visit: Payer: Self-pay

## 2024-07-23 ENCOUNTER — Encounter (HOSPITAL_BASED_OUTPATIENT_CLINIC_OR_DEPARTMENT_OTHER): Payer: Self-pay | Admitting: Emergency Medicine

## 2024-07-23 ENCOUNTER — Emergency Department (HOSPITAL_BASED_OUTPATIENT_CLINIC_OR_DEPARTMENT_OTHER)
Admission: EM | Admit: 2024-07-23 | Discharge: 2024-07-23 | Disposition: A | Discharge status: Home / Self Care | Source: Ambulatory Visit | Attending: Emergency Medicine | Admitting: Emergency Medicine

## 2024-07-23 DIAGNOSIS — S61213A Laceration without foreign body of left middle finger without damage to nail, initial encounter: Secondary | ICD-10-CM | POA: Diagnosis not present

## 2024-07-23 DIAGNOSIS — Z23 Encounter for immunization: Secondary | ICD-10-CM | POA: Insufficient documentation

## 2024-07-23 DIAGNOSIS — W260XXA Contact with knife, initial encounter: Secondary | ICD-10-CM | POA: Diagnosis not present

## 2024-07-23 DIAGNOSIS — S6992XA Unspecified injury of left wrist, hand and finger(s), initial encounter: Secondary | ICD-10-CM | POA: Diagnosis present

## 2024-07-23 MED ORDER — TETANUS-DIPHTH-ACELL PERTUSSIS 5-2-15.5 LF-MCG/0.5 IM SUSP
0.5000 mL | Freq: Once | INTRAMUSCULAR | Status: AC
  Administered 2024-07-23: 0.5 mL via INTRAMUSCULAR
  Filled 2024-07-23: qty 0.5

## 2024-07-23 MED ORDER — SILVER NITRATE-POT NITRATE 75-25 % EX MISC
1.0000 | Freq: Once | CUTANEOUS | Status: AC
  Administered 2024-07-23: 1 via TOPICAL
  Filled 2024-07-23: qty 10

## 2024-07-23 NOTE — ED Triage Notes (Addendum)
 Pt caox4 ambulatory c/o lac to L middle finger stating last night she cut it on a kitchen knife while cutting celery. Pt does not know when last Tdap was done but states her doctor said she doesn't need one.

## 2024-07-23 NOTE — Discharge Instructions (Signed)
 As we discussed you have a laceration which would not benefit from suture repair today, it is going to heal by what is called secondary intention from the inside out.  You can change the dressing once daily or more frequently if it gets soiled.  I would cleanse the area with Dial soap, water whenever you are changing the dressing, and monitor for any signs of infection including worsening pain, redness, pus draining from the affected site.  After around 7 to 10 days is reasonable to switch to a regular dressing.

## 2024-07-23 NOTE — ED Notes (Signed)
Meds bedside

## 2024-07-23 NOTE — ED Notes (Signed)
Pt verbalized dc instructions

## 2024-07-23 NOTE — ED Provider Notes (Signed)
 [place] [facility] AT [facility] Provider Note   CSN: [id number] Arrival date & time: 07/23/24  1012     Patient presents with: Laceration   Alexa Morris is a 78 y.o. female with overall noncontributory past medical history who presents with laceration to left middle finger last night she cut it with a kitchen knife while cutting celery.  She does not know when her last tetanus was but thinks that is up-to-date after talking to her doctor this morning.  She denies any other injury.  She denies any difficulty bending the finger.    Laceration      Prior to Admission medications   Medication Sig Start Date End Date Taking? Authorizing Provider  acetaminophen (TYLENOL) 325 MG tablet Take 650 mg by mouth every 6 (six) hours as needed.    [provider]  Cholecalciferol (D3-1000) 25 MCG (1000 UT) tablet Take 1,000 Units by mouth daily.    [provider]  esomeprazole (NEXIUM) 20 MG capsule Take 20 mg by mouth daily at 12 noon.    [provider]  ezetimibe (ZETIA) 10 MG tablet Take 10 mg by mouth daily.    [provider]  Fluticasone Propionate (FLONASE ALLERGY RELIEF NA) Place into the nose as needed.    [provider]  Multiple Vitamin (MULTIVITAMIN WITH MINERALS) TABS tablet Take 1 tablet by mouth daily.    [provider]  Probiotic Product (PROBIOTIC ADVANCED PO) Take 365 mg by mouth.    [provider]  rosuvastatin (CRESTOR) 20 MG tablet Take 20 mg by mouth daily.    [provider]  rosuvastatin (CRESTOR) 20 MG tablet Take 1 tablet by mouth daily.    [provider]  Simethicone (GAS RELIEF DROPS PO) Take by mouth as needed.    [provider]    Allergies: Poison ivy treatments, Sulfamethoxazole -trimethoprim , and Aspirin    Review of Systems  All other systems reviewed and are negative.   Updated Vital Signs BP (!) 157/85   Pulse 71   Temp 97.7 F  (36.5 C) (Oral)   Resp 18   Ht 5' 2.5 (1.588 m)   Wt 56.7 kg   SpO2 99%   BMI 22.50 kg/m   Physical Exam Vitals and nursing note reviewed.  Constitutional:      General: She is not in acute distress.    Appearance: Normal appearance.  HENT:     Head: Normocephalic and atraumatic.  Eyes:     General:        Right eye: No discharge.        Left eye: No discharge.  Cardiovascular:     Rate and Rhythm: Normal rate and regular rhythm.  Pulmonary:     Effort: Pulmonary effort is normal. No respiratory distress.  Musculoskeletal:        General: No deformity.     Comments: Normal range of motion to flexion, extension of the DIP on the affected finger  Skin:    General: Skin is warm and dry.     Comments: Patient with surface laceration through dermis with mild bleeding on the very tip of left middle finger , no foreign body, no deeper laceration or dehiscence  Neurological:     Mental Status: She is alert and oriented to person, place, and time.  Psychiatric:        Mood and Affect: Mood normal.        Behavior: Behavior normal.     (  all labs ordered are listed, but only abnormal results are displayed) Labs Reviewed - No data to display  EKG: None  Radiology: No results found.   Procedures   Medications Ordered in the ED  Tdap (ADACEL) injection 0.5 mL (has no administration in time range)  silver nitrate applicators applicator 1 Stick (1 Stick Topical Given 07/23/24 1054)                                    Medical Decision Making Risk Prescription drug management.   This is an overall well-appearing 78yo female who presents with a laceration of the left middle finger.  The wound was explored through full range of motion, there is no evidence of foreign body, fascia rupture, damage to the deep vessels, capillary refill is intact distal to the site of the laceration.  Patient is does not know whether she is up-to-date on her tetanus, tetanus is updated today.  They are neurovascularly intact throughout on my exam, intact strength of the affected extremity.   Wound was extensively cleaned, and repaired as described above.  Due to the nature of the wound there is no indication for suture repair, given some ongoing bleeding used silver nitrate to cauterize, Vaseline impregnated dressing applied, supplies for future dressings provided.  Patient tolerated the procedure without difficulty.  Nonadherent dressing was placed.  Patient instructed to monitor for signs of infection including worsening pain, redness, pus draining from the affected site.  Instructed to keep the wound clean, bandage, and change the bandage at least once daily.  Patient understands and agrees to the plan, and is discharged in stable condition at this time.   Final diagnoses:  Laceration of left middle finger without foreign body without damage to nail, initial encounter    ED Discharge Orders     None          Rosan Sherlean VEAR DEVONNA 07/23/24 1122    Lenor Hollering, MD 07/23/24 1318
